# Patient Record
Sex: Female | Born: 1965 | Race: Black or African American | Hispanic: No | Marital: Married | Smoking: Former smoker
Health system: Southern US, Community
[De-identification: ages and names within clinical notes are randomized; demographics above are authoritative.]

## PROBLEM LIST (undated history)

## (undated) DIAGNOSIS — I10 Essential (primary) hypertension: Secondary | ICD-10-CM

## (undated) DIAGNOSIS — M549 Dorsalgia, unspecified: Secondary | ICD-10-CM

## (undated) DIAGNOSIS — J45909 Unspecified asthma, uncomplicated: Secondary | ICD-10-CM

## (undated) DIAGNOSIS — K219 Gastro-esophageal reflux disease without esophagitis: Secondary | ICD-10-CM

## (undated) DIAGNOSIS — D649 Anemia, unspecified: Secondary | ICD-10-CM

## (undated) HISTORY — PX: ESOPHAGOGASTRODUODENOSCOPY: SHX1529

## (undated) HISTORY — PX: LAPAROSCOPIC GASTRIC BANDING: SHX1100

## (undated) HISTORY — PX: COLONOSCOPY: SHX5424

## (undated) HISTORY — PX: CHOLECYSTECTOMY: SHX55

## (undated) HISTORY — PX: BACK SURGERY: SHX140

## (undated) HISTORY — PX: GASTRIC BYPASS: SHX52

---

## 2010-03-22 HISTORY — PX: REDUCTION MAMMAPLASTY: SUR839

## 2015-12-25 ENCOUNTER — Encounter: Payer: Self-pay | Admitting: *Deleted

## 2015-12-25 ENCOUNTER — Ambulatory Visit
Admission: EM | Admit: 2015-12-25 | Discharge: 2015-12-25 | Disposition: A | Discharge status: Home / Self Care | Payer: Federal, State, Local not specified - PPO | Attending: Family Medicine | Admitting: Family Medicine

## 2015-12-25 DIAGNOSIS — M62830 Muscle spasm of back: Secondary | ICD-10-CM | POA: Diagnosis not present

## 2015-12-25 DIAGNOSIS — M5440 Lumbago with sciatica, unspecified side: Secondary | ICD-10-CM | POA: Diagnosis not present

## 2015-12-25 HISTORY — DX: Anemia, unspecified: D64.9

## 2015-12-25 HISTORY — DX: Dorsalgia, unspecified: M54.9

## 2015-12-25 MED ORDER — KETOROLAC TROMETHAMINE 60 MG/2ML IM SOLN
60.0000 mg | Freq: Once | INTRAMUSCULAR | Status: AC
  Administered 2015-12-25: 60 mg via INTRAMUSCULAR
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
## 2015-12-27 ENCOUNTER — Telehealth: Payer: Self-pay | Admitting: Emergency Medicine

## 2015-12-27 NOTE — Telephone Encounter (Signed)
Tried calling patient- no answer.  Left message for patient to call us back if her pain and symptoms are not improving.

## 2016-07-16 ENCOUNTER — Ambulatory Visit
Admission: EM | Admit: 2016-07-16 | Discharge: 2016-07-16 | Disposition: A | Discharge status: Home / Self Care | Payer: Federal, State, Local not specified - PPO | Attending: Family Medicine | Admitting: Family Medicine

## 2016-07-16 ENCOUNTER — Encounter: Payer: Self-pay | Admitting: Emergency Medicine

## 2016-07-16 DIAGNOSIS — J029 Acute pharyngitis, unspecified: Secondary | ICD-10-CM | POA: Diagnosis not present

## 2016-07-16 DIAGNOSIS — R05 Cough: Secondary | ICD-10-CM | POA: Diagnosis not present

## 2016-07-16 LAB — RAPID STREP SCREEN (MED CTR MEBANE ONLY): STREPTOCOCCUS, GROUP A SCREEN (DIRECT): NEGATIVE

## 2016-07-16 MED ORDER — FLUTICASONE PROPIONATE 50 MCG/ACT NA SUSP: 2.0000 | Freq: Every day | NASAL | 0 refills | Status: DC

## 2016-07-16 NOTE — ED Triage Notes (Signed)
Patient c/o sore throat for the past few days.  Patient denies fevers.

## 2016-07-16 NOTE — ED Provider Notes (Signed)
CSN: [id number]     Arrival date & time 07/16/16  1641 History   None    Chief Complaint  Patient presents with  . Sore Throat   (Consider location/radiation/quality/duration/timing/severity/associated sxs/prior Treatment) HPI  [name] who presents with a sore throat that she's had for the past few days. States it is hurting constantly notches with swallowing. She has a mild cough that is not bothersome. She's had no fever or chills.        Past Medical History:  Diagnosis Date  . Anemia   . Back pain    Past Surgical History:  Procedure Laterality Date  . BACK SURGERY     History reviewed. No pertinent family history. Social History  Substance Use Topics  . Smoking status: Former Games developer  . Smokeless tobacco: Never Used  . Alcohol use No   OB History    No data available     Review of Systems  Constitutional: Negative for activity change, chills, fatigue and fever.  HENT: Positive for sore throat.   Respiratory: Positive for cough.   All other systems reviewed and are negative.   Allergies  Gabapentin; Morphine and related; Nsaids; and Topamax [topiramate]  Home Medications   Prior to Admission medications   Medication Sig Start Date End Date Taking? Authorizing Provider  fluticasone (FLONASE) 50 MCG/ACT nasal spray Place 2 sprays into both nostrils daily. 07/16/16   Lutricia Feil, PA-C   Meds Ordered and Administered this Visit  Medications - No data to display  BP 129/84 (BP Location: Left Arm)   Pulse 77   Temp 98.9 F (37.2 C) (Oral)   Resp 16   Ht  (1.676 m)   Wt 195 lb (88.5 kg)   SpO2 99%   BMI 31.47 kg/m  No data found.   Physical Exam  Constitutional: She is oriented to person, place, and time. She appears well-developed and well-nourished. No distress.  HENT:  Head: Normocephalic and atraumatic.  Right Ear: External ear normal.  Left Ear: External ear normal.  Nose: Nose normal.  Mouth/Throat: Oropharynx is  clear and moist. No oropharyngeal exudate.  Eyes: Pupils are equal, round, and reactive to light. Right eye exhibits no discharge. Left eye exhibits no discharge.  Neck: Normal range of motion.  Pulmonary/Chest: Effort normal and breath sounds normal. No respiratory distress. She has no wheezes. She has no rales.  Musculoskeletal: Normal range of motion.  Lymphadenopathy:    She has no cervical adenopathy.  Neurological: She is alert and oriented to person, place, and time.  Skin: Skin is warm and dry. She is not diaphoretic.  Psychiatric: She has a normal mood and affect. Her behavior is normal. Thought content normal.  Nursing note and vitals reviewed.   Urgent Care Course     Procedures (including critical care time)  Labs Review Labs Reviewed  RAPID STREP SCREEN (NOT AT [facility])  CULTURE, GROUP A STREP [facility])    Imaging Review No results found.   Visual Acuity Review  Right Eye Distance:   Left Eye Distance:   Bilateral Distance:    Right Eye Near:   Left Eye Near:    Bilateral Near:         MDM   1. Viral pharyngitis    Discharge Medication List as of 07/16/2016  5:15 PM    START taking these medications   Details  fluticasone (FLONASE) 50 MCG/ACT nasal spray Place 2 sprays into both nostrils daily., Starting Fri  07/16/2016, Normal      Plan: 1. Test/x-ray results and diagnosis reviewed with patient 2. rx as per orders; risks, benefits, potential side effects reviewed with patient 3. Recommend supportive treatment with Rest and fluids. Tylenol for aches and pains ; she is allergic to NSAIDs. Cultures/ sensitivities of the pharynx will be available in 48 hours. I told her that this is likely a viral illness does not require antibiotics at this time. However if the cultures are positive. She will be treated appropriately. I recommended that she consider use of Flonase and Claritin 4. F/u prn if symptoms worsen or don't improve     Lutricia Feil,  PA-C 07/16/16 1723

## 2016-07-18 ENCOUNTER — Telehealth: Payer: Self-pay

## 2016-07-18 LAB — CULTURE, GROUP A STREP (THRC)

## 2016-07-18 MED ORDER — AMOXICILLIN 500 MG PO CAPS: 500.0000 mg | ORAL_CAPSULE | Freq: Two times a day (BID) | ORAL | 0 refills | Status: DC

## 2016-10-12 ENCOUNTER — Telehealth: Payer: Self-pay

## 2016-10-12 NOTE — Telephone Encounter (Signed)
Per Raelyn Moraendra D, patient is switching to workers comp and will be calling to give me the information. However, we cannot accept a referral based on patient phone call. We must get referral from the adjuster with the approval reference number authorizing the visit.

## 2017-06-01 ENCOUNTER — Other Ambulatory Visit: Payer: Self-pay

## 2017-06-01 ENCOUNTER — Ambulatory Visit
Admission: EM | Admit: 2017-06-01 | Discharge: 2017-06-01 | Disposition: A | Discharge status: Home / Self Care | Payer: Federal, State, Local not specified - PPO | Attending: Family Medicine | Admitting: Family Medicine

## 2017-06-01 ENCOUNTER — Encounter: Payer: Self-pay | Admitting: Emergency Medicine

## 2017-06-01 ENCOUNTER — Ambulatory Visit (INDEPENDENT_AMBULATORY_CARE_PROVIDER_SITE_OTHER): Payer: Federal, State, Local not specified - PPO

## 2017-06-01 DIAGNOSIS — M542 Cervicalgia: Secondary | ICD-10-CM | POA: Diagnosis not present

## 2017-06-01 DIAGNOSIS — S161XXA Strain of muscle, fascia and tendon at neck level, initial encounter: Secondary | ICD-10-CM
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
## 2017-06-01 MED ORDER — CYCLOBENZAPRINE HCL 10 MG PO TABS: 10.0000 mg | ORAL_TABLET | Freq: Every day | ORAL | 0 refills | Status: DC

## 2017-06-01 MED ORDER — HYDROCODONE-ACETAMINOPHEN 5-325 MG PO TABS: ORAL_TABLET | ORAL | 0 refills | Status: DC
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
## 2017-09-12 ENCOUNTER — Emergency Department: Payer: Federal, State, Local not specified - PPO

## 2017-09-12 ENCOUNTER — Encounter: Payer: Self-pay | Admitting: Emergency Medicine

## 2017-09-12 ENCOUNTER — Other Ambulatory Visit: Payer: Self-pay

## 2017-09-12 ENCOUNTER — Emergency Department
Admission: EM | Admit: 2017-09-12 | Discharge: 2017-09-12 | Disposition: A | Discharge status: Home / Self Care | Payer: Federal, State, Local not specified - PPO | Attending: Student in an Organized Health Care Education/Training Program | Admitting: Student in an Organized Health Care Education/Training Program

## 2017-09-12 DIAGNOSIS — Z79899 Other long term (current) drug therapy: Secondary | ICD-10-CM | POA: Diagnosis not present

## 2017-09-12 DIAGNOSIS — Z87891 Personal history of nicotine dependence: Secondary | ICD-10-CM | POA: Diagnosis not present

## 2017-09-12 DIAGNOSIS — R202 Paresthesia of skin: Secondary | ICD-10-CM | POA: Diagnosis present

## 2017-09-12 DIAGNOSIS — M5412 Radiculopathy, cervical region: Secondary | ICD-10-CM | POA: Diagnosis not present
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
## 2017-09-12 MED ORDER — PREDNISONE 50 MG PO TABS: ORAL_TABLET | ORAL | 0 refills | Status: DC
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
## 2017-09-12 NOTE — ED Triage Notes (Signed)
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  Today arrives with c/o right neck pain and feeling of hand numbness x 3 days.  AAOx3.  Skin warm and dry. NAD  MAE equally and strong.

## 2017-12-02 ENCOUNTER — Ambulatory Visit
Admission: EM | Admit: 2017-12-02 | Discharge: 2017-12-02 | Disposition: A | Discharge status: Home / Self Care | Payer: Federal, State, Local not specified - PPO | Attending: Family Medicine | Admitting: Family Medicine

## 2017-12-02 ENCOUNTER — Encounter: Payer: Self-pay | Admitting: Emergency Medicine

## 2017-12-02 ENCOUNTER — Other Ambulatory Visit: Payer: Self-pay

## 2017-12-02 DIAGNOSIS — Z885 Allergy status to narcotic agent status: Secondary | ICD-10-CM | POA: Insufficient documentation

## 2017-12-02 DIAGNOSIS — Z79899 Other long term (current) drug therapy: Secondary | ICD-10-CM | POA: Diagnosis not present

## 2017-12-02 DIAGNOSIS — R0789 Other chest pain: Secondary | ICD-10-CM | POA: Insufficient documentation

## 2017-12-02 DIAGNOSIS — Z888 Allergy status to other drugs, medicaments and biological substances status: Secondary | ICD-10-CM | POA: Diagnosis not present

## 2017-12-02 DIAGNOSIS — Z87891 Personal history of nicotine dependence: Secondary | ICD-10-CM | POA: Diagnosis not present

## 2017-12-02 DIAGNOSIS — Z886 Allergy status to analgesic agent status: Secondary | ICD-10-CM | POA: Diagnosis not present

## 2017-12-02 MED ORDER — PREDNISONE 50 MG PO TABS: ORAL_TABLET | ORAL | 0 refills | Status: DC

## 2017-12-02 NOTE — ED Triage Notes (Signed)
Patient c/o chest pain that started this morning.  Patient denies SOB.

## 2017-12-02 NOTE — Discharge Instructions (Addendum)
May apply ice to the area 20 minutes every 2 hours 4-5 times daily.  Your pain worsens or if the character changes in any way go to the emergency room tonight.  Otherwise follow-up with your primary care physician next week

## 2017-12-02 NOTE — ED Provider Notes (Signed)
[facility]    CSN: [id number] Arrival date & time: 12/02/17  1739     History   Chief Complaint Chief Complaint  Patient presents with  . Chest Pain    HPI Armilda Vanderlinden is a 52 y.o. female.   HPI  52 year old female that presents with right-sided anterior chest pain started this morning.  She denies any shortness of breath diaphoresis.  She states that the "pain" initially was in her right upper extremity mostly over the volar forearm went up into her chest.  She does not characterize it as pain.  She has a very difficult time describing exactly what she is feeling.  She is essentially a poor historian.  Present time she has no shortness of breath.  She states that the pain is all around the right breast and around her side.  She has a history of recent radiculopathy from the cervical region and she says that this is not very similar.  Has no cardiac history.        Past Medical History:  Diagnosis Date  . Anemia   . Back pain     There are no active problems to display for this patient.   Past Surgical History:  Procedure Laterality Date  . BACK SURGERY      OB History   None      Home Medications    Prior to Admission medications   Medication Sig Start Date End Date Taking? Authorizing Provider  calcium citrate-vitamin D (CITRACAL+D) 315-200 MG-UNIT tablet Take by mouth. 07/29/17  Yes [provider]  DULoxetine (CYMBALTA) 60 MG capsule Take by mouth. 06/03/17  Yes [provider]  FeFum-FePo-FA-B Cmp-C-Zn-Mn-Cu (SE-TAN PLUS) 162-115.2-1 MG CAPS Take by mouth. 07/29/17  Yes [provider]  traZODone (DESYREL) 100 MG tablet Take 100 mg by mouth at bedtime.   Yes [provider]  acetaminophen (TYLENOL) 500 MG tablet Take 1,000 mg by mouth every 6 (six) hours as needed.    [provider]  amoxicillin (AMOXIL) 500 MG capsule Take 1 capsule (500 mg total) by mouth 2 (two) times daily. 07/18/16    Lutricia Feil, PA-C  Ascorbic Acid 1000 MG TBCR Take 1 tablet by mouth daily.    [provider]  Calcium 500 MG CHEW Chew 1 tablet by mouth 2 (two) times daily.    [provider]  Cholecalciferol (VITAMIN D) 2000 units CAPS Take 1 capsule by mouth daily.    [provider]  Cyanocobalamin (VITAMIN B-12 IJ) Inject as directed every 30 (thirty) days.    [provider]  cyclobenzaprine (FLEXERIL) 10 MG tablet Take 1 tablet (10 mg total) by mouth at bedtime. 06/01/17   Payton Mccallum, MD  Multiple Vitamin (MULTIVITAMIN) tablet Take 2 tablets by mouth daily.    [provider]  predniSONE (DELTASONE) 50 MG tablet 1 tablet daily for the next 5 days 12/02/17   Lutricia Feil, PA-C    Family History Family History  Problem Relation Age of Onset  . Hypertension Mother   . Diabetes Mother   . Pulmonary Hypertension Mother   . Heart disease Mother   . Renal Disease Mother   . Osteoarthritis Mother   . Hyperlipidemia Mother   . COPD Father   . Multiple sclerosis Father     Social History Social History   Tobacco Use  . Smoking status: Former Smoker    Last attempt to quit: 06/02/2007    Years since quitting: 10.5  .  Smokeless tobacco: Never Used  Substance Use Topics  . Alcohol use: Yes    Comment: socially  . Drug use: No     Allergies   Gabapentin; Morphine and related; Nsaids; and Topamax [topiramate]   Review of Systems Review of Systems  Constitutional: Positive for activity change. Negative for chills, fatigue and fever.  Cardiovascular: Positive for chest pain.  All other systems reviewed and are negative.    Physical Exam Triage Vital Signs ED Triage Vitals  Enc Vitals Group     BP 12/02/17 1750 (!) 128/93     Pulse Rate 12/02/17 1750 67     Resp 12/02/17 1750 16     Temp 12/02/17 1750 98.5 F (36.9 C)     Temp Source 12/02/17 1750 Oral     SpO2 12/02/17 1750 100 %     Weight 12/02/17 1746 190 lb (86.2 kg)       Height 12/02/17 1746 5' 6.5" (1.689 m)     Head Circumference --      Peak Flow --      Pain Score 12/02/17 1746 0     Pain Loc --      Pain Edu? --      Excl. in GC? --    No data found.  Updated Vital Signs BP (!) 128/93 (BP Location: Left Arm)   Pulse 67   Temp 98.5 F (36.9 C) (Oral)   Resp 16   Ht 5' 6.5" (1.689 m)   Wt 190 lb (86.2 kg)   SpO2 100%   BMI 30.21 kg/m   Visual Acuity Right Eye Distance:   Left Eye Distance:   Bilateral Distance:    Right Eye Near:   Left Eye Near:    Bilateral Near:     Physical Exam  Constitutional: She is oriented to person, place, and time. She appears well-developed and well-nourished.  Non-toxic appearance. She does not appear ill. No distress.  HENT:  Head: Normocephalic.  Eyes: Pupils are equal, round, and reactive to light.  Neck: Normal range of motion. Neck supple.  Cardiovascular: Normal rate and regular rhythm. Exam reveals no gallop, no S3, no S4 and no distant heart sounds.  No murmur heard. Nation of the chest shows tenderness over the second third intercostal junction the pain radiates out through the ribs around the inferior portion of the breast and into the lateral right rib cage.  This also is tender towards the thoracic's area.  Patient has full thoracic rotation.  Pain is inconsistent with palpation.  No corresponding pain over the left chest wall.  Pulmonary/Chest: Effort normal and breath sounds normal.  Musculoskeletal: Normal range of motion.  Neurological: She is alert and oriented to person, place, and time.  Skin: Skin is warm and dry.  Psychiatric: She has a normal mood and affect. Her behavior is normal.  Nursing note and vitals reviewed.    UC Treatments / Results  Labs (all labs ordered are listed, but only abnormal results are displayed) Labs Reviewed - No data to display  EKG ED ECG REPORT   Date: 12/02/2017  EKG Time: 7:08 PM  Rate: 63 Rhythm: normal sinus rhythm,   there are no  previous tracings available for comparison Intervals:none  ST&T Change: Nonspecific T wave  Narrative Interpretation: Normal sinus rhythm with nonspecific T wave abnormality no acute changes are seen          Radiology No results found.  Procedures Procedures (including critical care time)  Medications  Ordered in UC Medications - No data to display  Initial Impression / Assessment and Plan / UC Course  I have reviewed the triage vital signs and the nursing notes.  Pertinent labs & imaging results that were available during my care of the patient were reviewed by me and considered in my medical decision making (see chart for details).    Plan: 1. Test/x-ray results and diagnosis reviewed with patient 2. rx as per orders; risks, benefits, potential side effects reviewed with patient 3. Recommend supportive treatment with prednisone for anti-inflammatory effect.  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  Noted that I suspect this is chest wall pain and does not represent any cardiac pain.  However if she had any change in the pain in intensifying or any change in the character she should go immediately to the emergency room.  Otherwise she should follow-up with her primary care next week. 4. F/u prn if symptoms worsen or don't improve  Final Clinical Impressions(s) / UC Diagnoses   Final diagnoses:  Chest wall pain     Discharge Instructions     May apply ice to the area 20 minutes every 2 hours 4-5 times daily.  Your pain worsens or if the character changes in any way go to the emergency room tonight.  Otherwise follow-up with your primary care physician next week    ED Prescriptions    Medication Sig Dispense Auth. Provider   predniSONE (DELTASONE) 50 MG tablet 1 tablet daily for the next 5 days 5 tablet Lutricia Feiloemer, William P, PA-C     Controlled Substance Prescriptions [place] Controlled Substance Registry consulted? Not Applicable   Joesph JulyRoemer, William P,  PA-C 12/02/17 1908

## 2018-11-14 ENCOUNTER — Other Ambulatory Visit: Payer: Self-pay | Admitting: Medical Oncology

## 2018-11-14 DIAGNOSIS — Z1231 Encounter for screening mammogram for malignant neoplasm of breast: Secondary | ICD-10-CM

## 2018-11-30 ENCOUNTER — Ambulatory Visit
Admission: RE | Admit: 2018-11-30 | Discharge: 2018-11-30 | Disposition: A | Discharge status: Home / Self Care | Payer: Federal, State, Local not specified - PPO | Source: Ambulatory Visit | Attending: Medical Oncology | Admitting: Medical Oncology

## 2018-11-30 ENCOUNTER — Other Ambulatory Visit: Payer: Self-pay

## 2018-11-30 ENCOUNTER — Encounter (INDEPENDENT_AMBULATORY_CARE_PROVIDER_SITE_OTHER): Payer: Self-pay

## 2018-11-30 DIAGNOSIS — Z1231 Encounter for screening mammogram for malignant neoplasm of breast: Secondary | ICD-10-CM | POA: Diagnosis not present

## 2018-11-30 IMAGING — MG MM DIGITAL SCREENING BILAT W/ TOMO W/ CAD
8 series · 8 of 24 positions shown · non-contrast
Comparison: Previous exam(s).

CLINICAL DATA: Screening.

EXAM:
DIGITAL SCREENING BILATERAL MAMMOGRAM WITH TOMO AND CAD

[R MLO synth-2D]
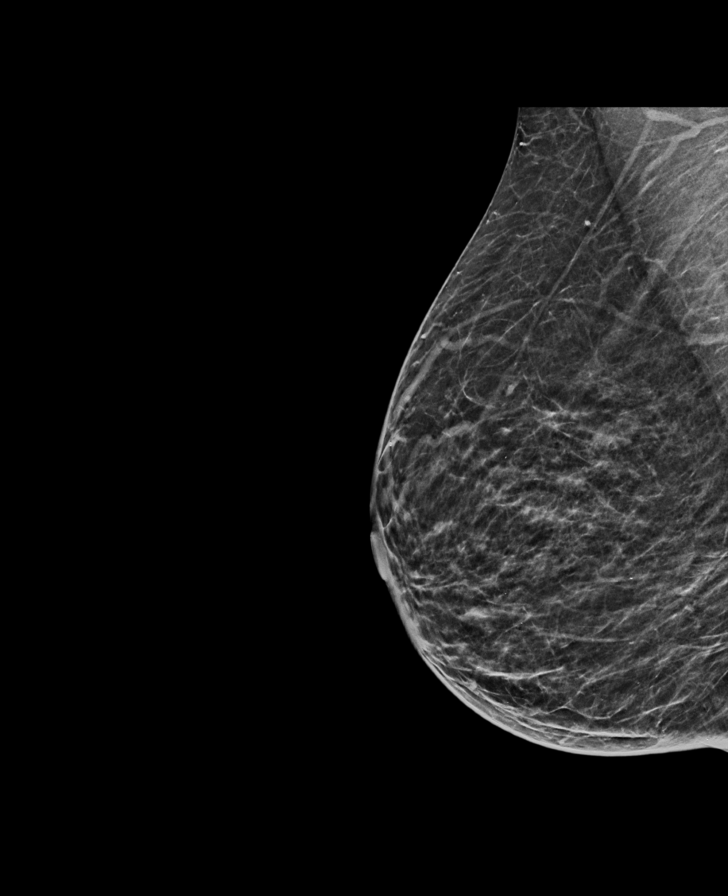

[L MLO synth-2D]
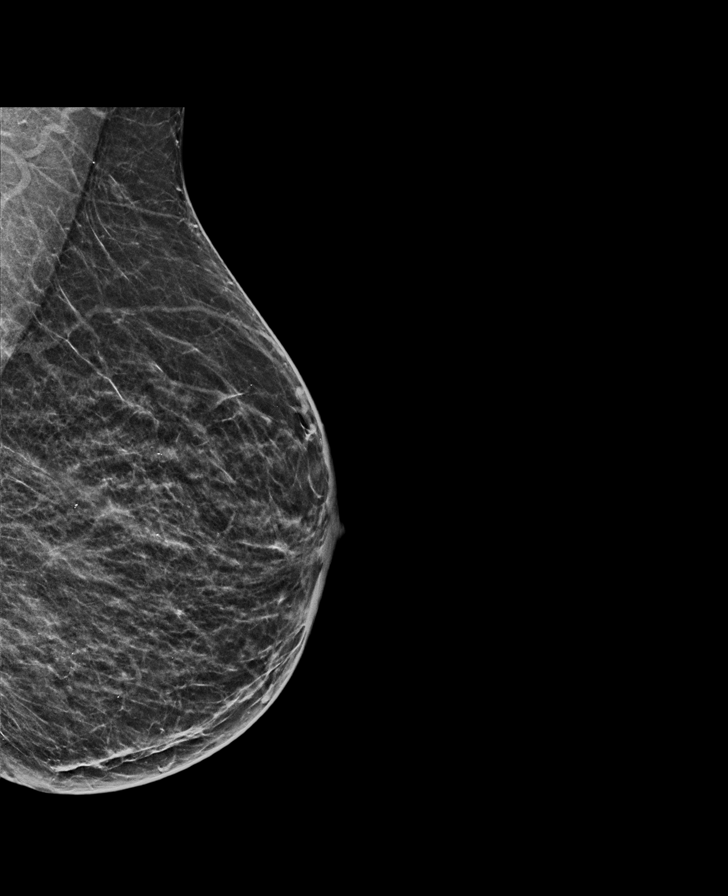

[R CC synth-2D]
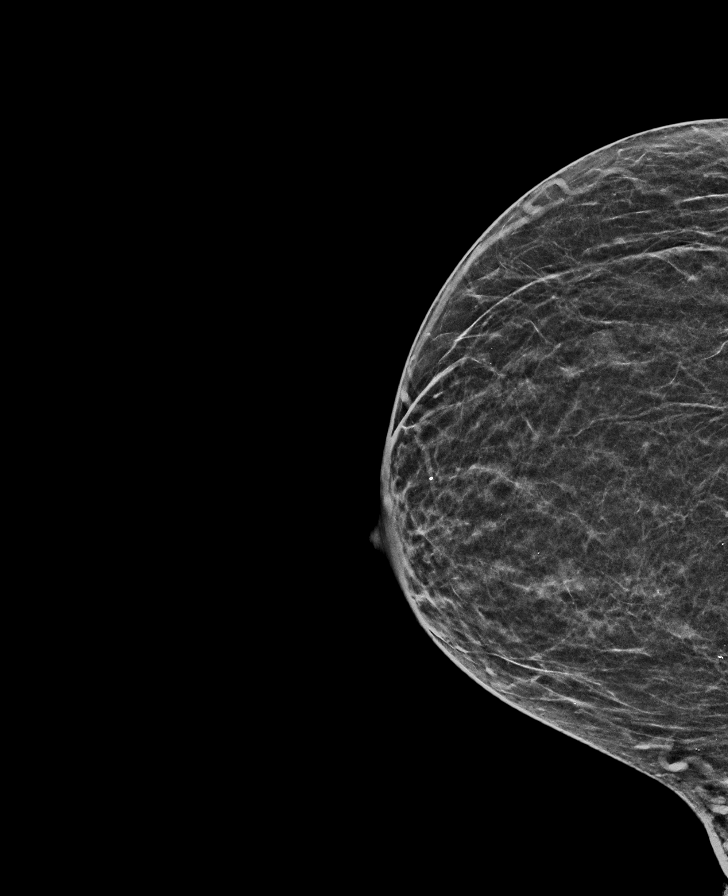

[L CC synth-2D]
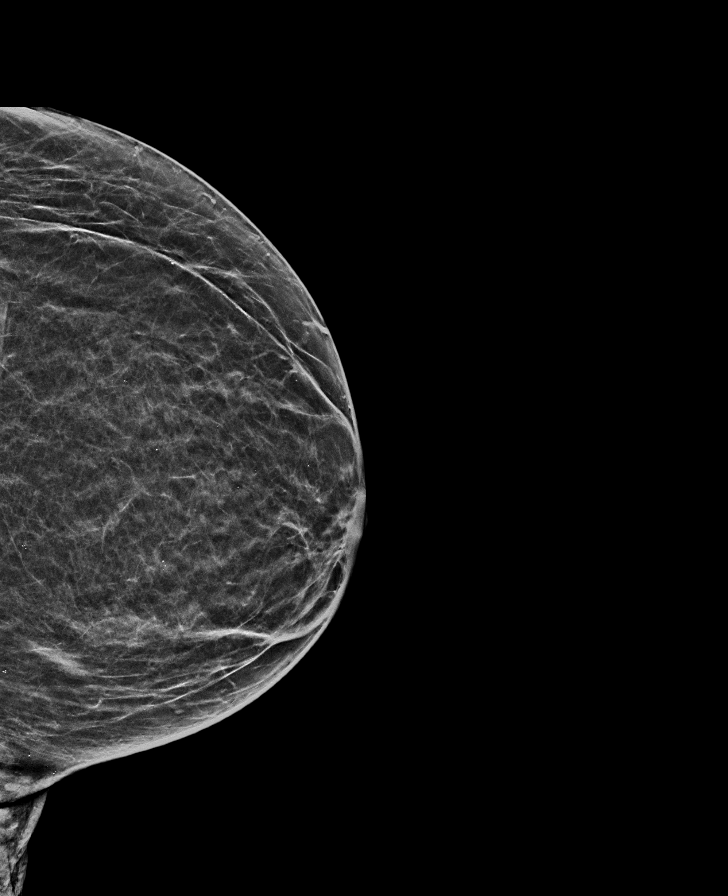

[R CC tomo · tomo slice 23/46.0]
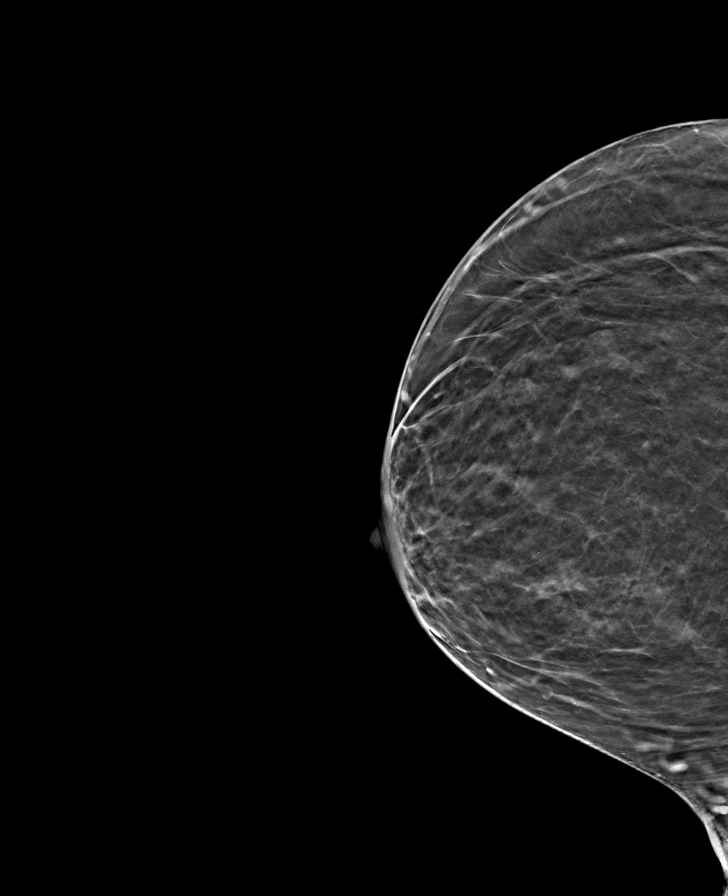

[L MLO tomo · tomo slice 25/50.0]
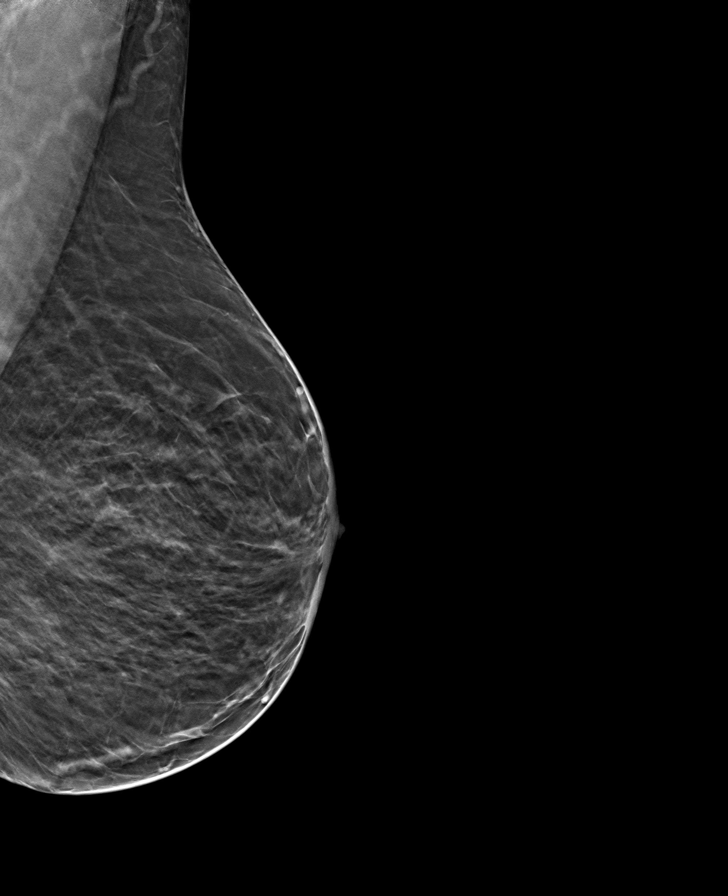

[R MLO tomo · tomo slice 25/49.0]
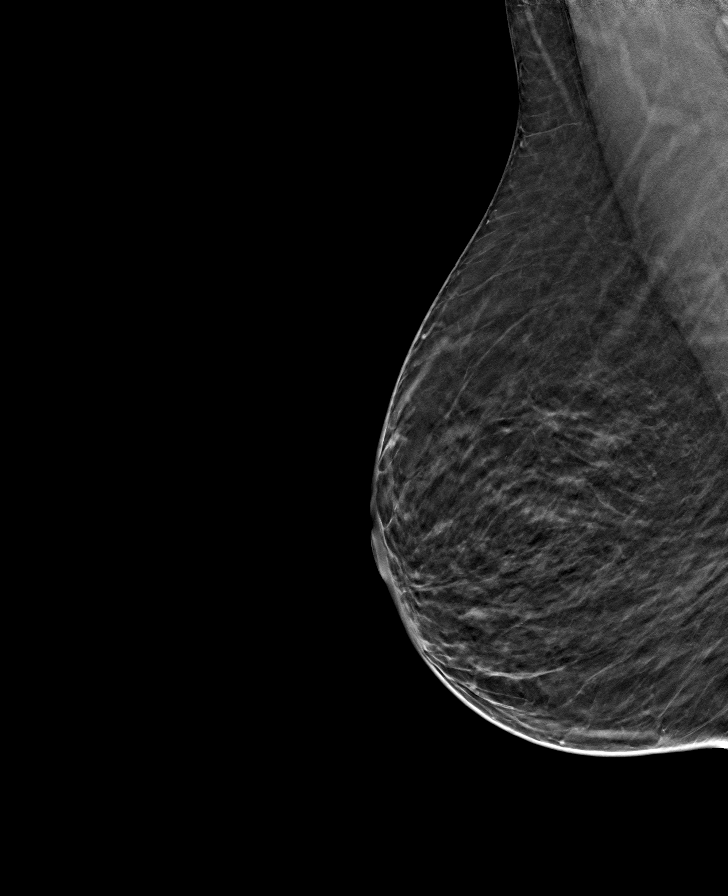

[L CC tomo · tomo slice 25/48.0]
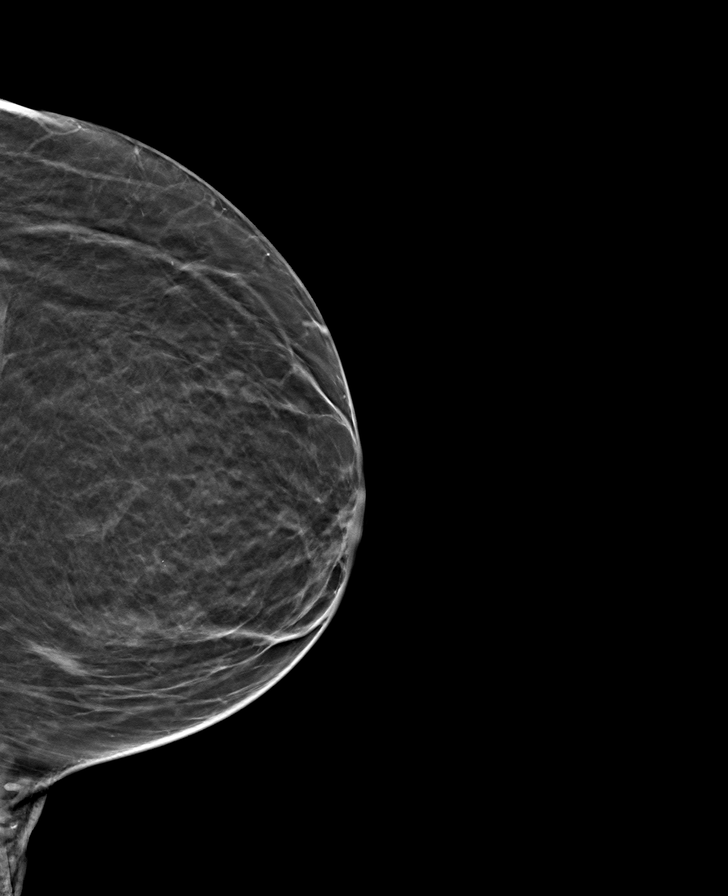

[8 of 24 positions shown; findings below may reference images not displayed]

ACR Breast Density Category b: There are scattered areas of
fibroglandular density.
FINDINGS: There are no findings suspicious for malignancy. Images were
processed with CAD.
IMPRESSION: No mammographic evidence of malignancy. A result letter of this
screening mammogram will be mailed directly to the patient.

RECOMMENDATION:
Screening mammogram in one year. (Code:[TQ])

BI-RADS CATEGORY  1: Negative.

## 2019-10-29 ENCOUNTER — Other Ambulatory Visit: Payer: Self-pay | Admitting: Medical Oncology

## 2019-10-29 DIAGNOSIS — Z1231 Encounter for screening mammogram for malignant neoplasm of breast: Secondary | ICD-10-CM

## 2019-12-03 ENCOUNTER — Other Ambulatory Visit: Payer: Self-pay

## 2019-12-03 ENCOUNTER — Ambulatory Visit
Admission: RE | Admit: 2019-12-03 | Discharge: 2019-12-03 | Disposition: A | Discharge status: Home / Self Care | Payer: Federal, State, Local not specified - PPO | Source: Ambulatory Visit | Attending: Medical Oncology | Admitting: Medical Oncology

## 2019-12-03 DIAGNOSIS — Z1231 Encounter for screening mammogram for malignant neoplasm of breast: Secondary | ICD-10-CM | POA: Insufficient documentation

## 2019-12-03 IMAGING — MG DIGITAL SCREENING BILAT W/ TOMO W/ CAD
6 of 12 series · 6 of 36 positions shown · non-contrast
Comparison: Previous exam(s).

CLINICAL DATA: Screening.

EXAM:
DIGITAL SCREENING BILATERAL MAMMOGRAM WITH TOMO AND CAD

[L MLO synth-2D]
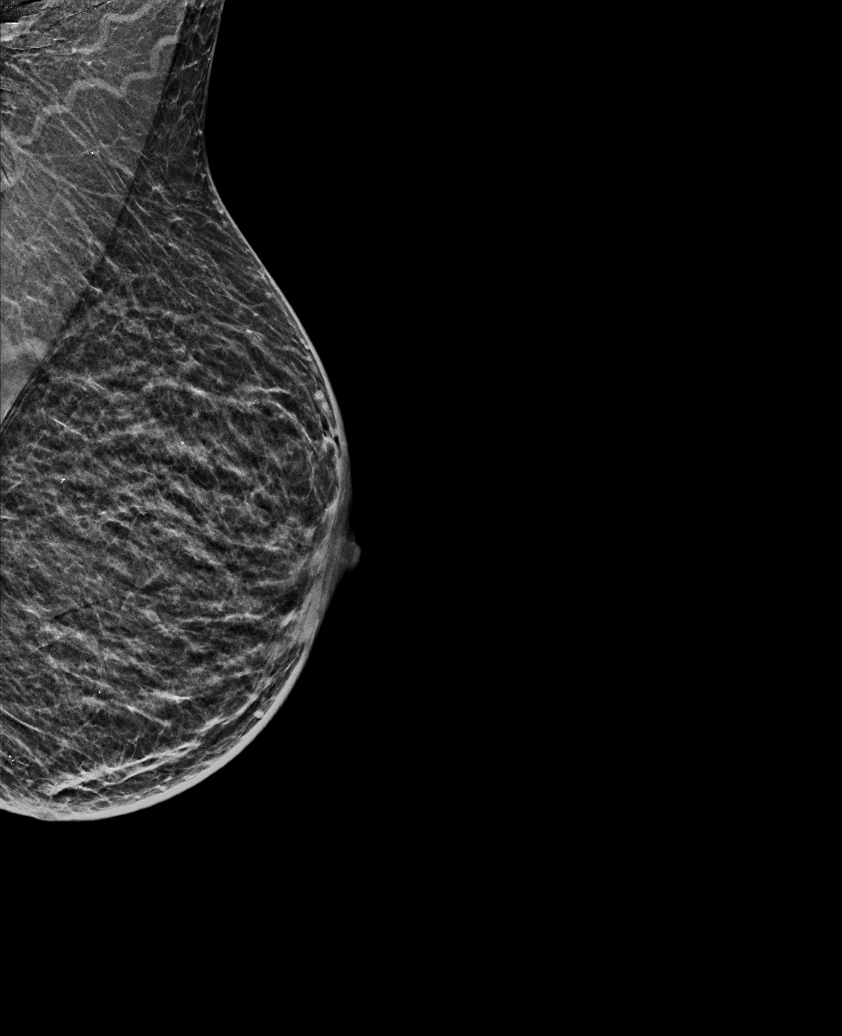

[L CC synth-2D (1 of 2)]
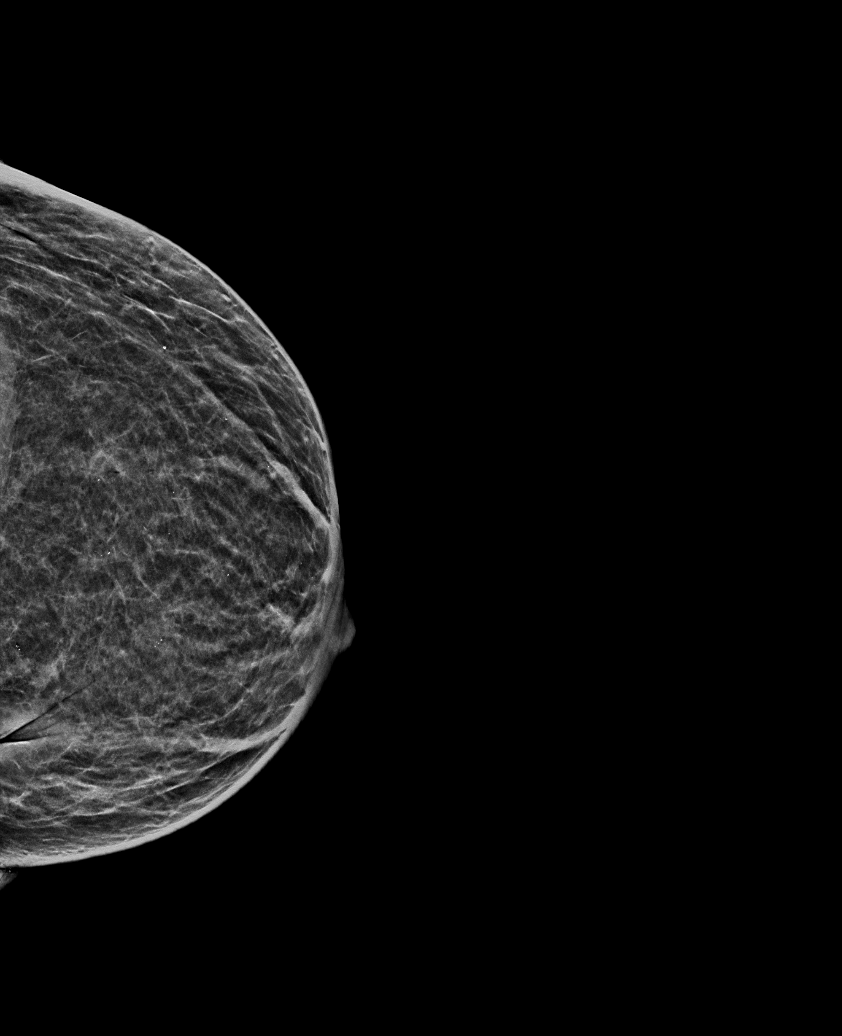

[R MLO synth-2D]
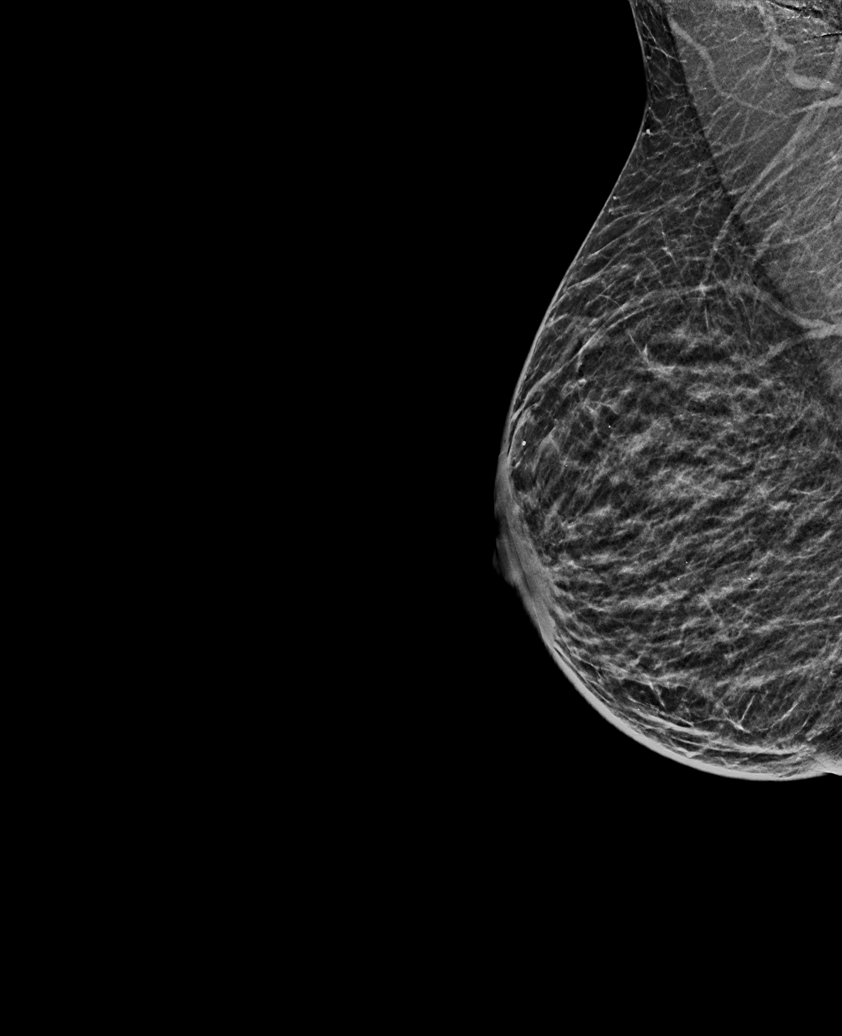

[L CC synth-2D (2 of 2)]
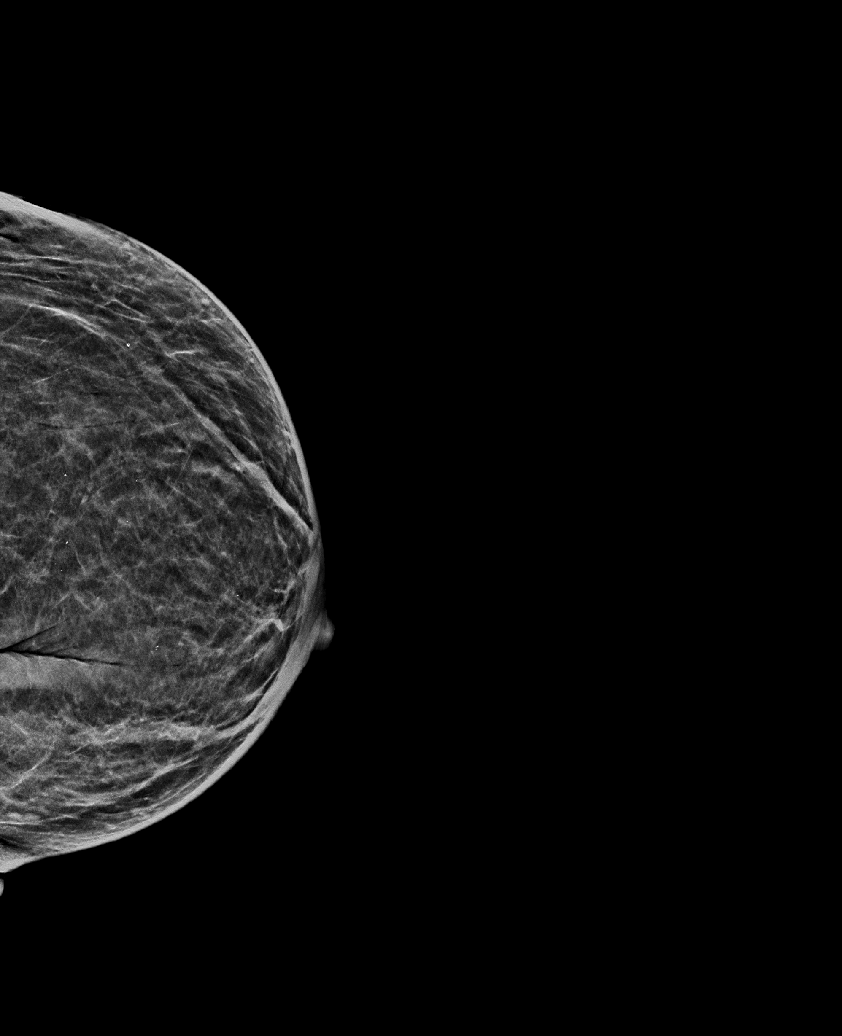

[L XCCM synth-2D]
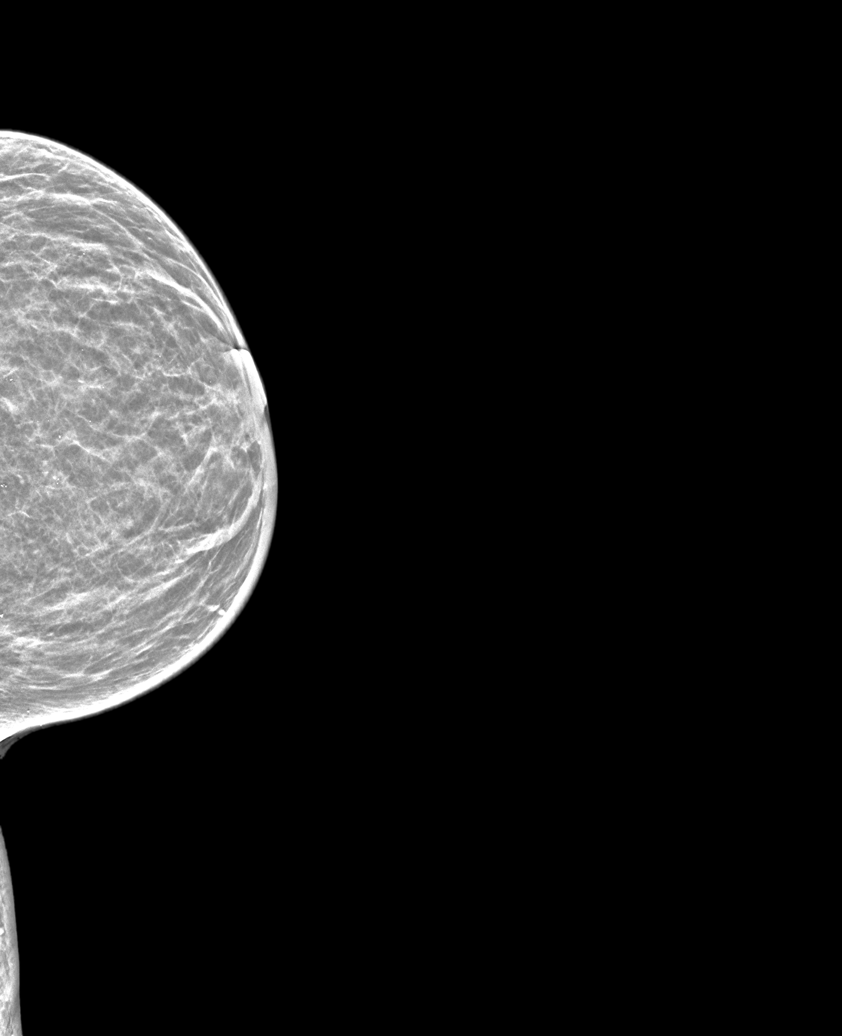

[R CC synth-2D]
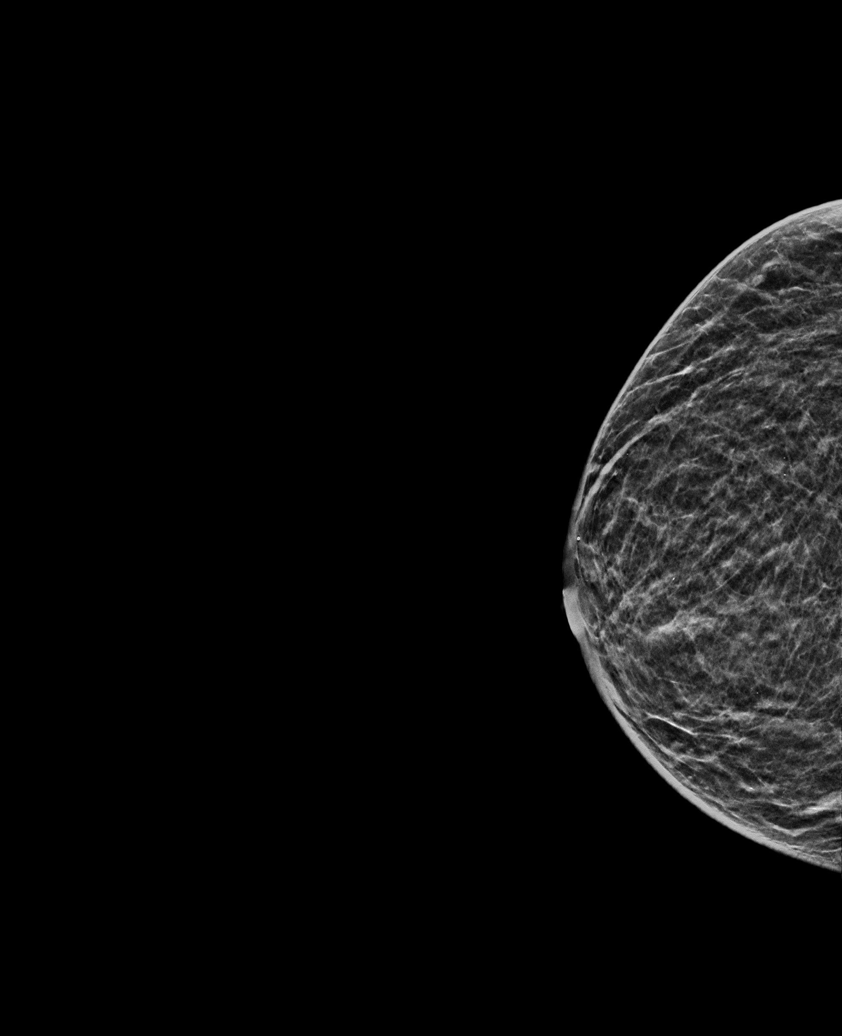

[6 of 36 positions shown; findings below may reference images not displayed]

ACR Breast Density Category b: There are scattered areas of
fibroglandular density.
FINDINGS: There are no findings suspicious for malignancy. Images were
processed with CAD.
IMPRESSION: No mammographic evidence of malignancy. A result letter of this
screening mammogram will be mailed directly to the patient.

RECOMMENDATION:
Screening mammogram in one year. (Code:[TQ])

BI-RADS CATEGORY  1: Negative.

## 2020-02-27 ENCOUNTER — Other Ambulatory Visit: Payer: Self-pay | Admitting: Podiatry

## 2020-03-03 ENCOUNTER — Inpatient Hospital Stay: Admission: RE | Admit: 2020-03-03 | Payer: Federal, State, Local not specified - PPO | Source: Ambulatory Visit

## 2020-03-04 ENCOUNTER — Other Ambulatory Visit: Payer: Self-pay

## 2020-03-04 ENCOUNTER — Encounter
Admission: RE | Admit: 2020-03-04 | Discharge: 2020-03-04 | Disposition: A | Discharge status: Home / Self Care | Payer: Federal, State, Local not specified - PPO | Source: Ambulatory Visit | Attending: Podiatry | Admitting: Podiatry

## 2020-03-04 HISTORY — DX: Gastro-esophageal reflux disease without esophagitis: K21.9

## 2020-03-04 HISTORY — DX: Unspecified asthma, uncomplicated: J45.909

## 2020-03-04 HISTORY — DX: Essential (primary) hypertension: I10

## 2020-03-04 NOTE — Patient Instructions (Signed)
Your procedure is scheduled on: Friday March 07, 2020 Report to the Registration Desk on the 1st floor of the [facility] To find out your arrival time, please call [phone] between 1PM - 3PM on: Thursday March 06, 2020  REMEMBER: Instructions that are not followed completely may result in serious medical risk, up to and including death; or upon the discretion of your surgeon and anesthesiologist your surgery may need to be rescheduled.  Do not eat food after midnight the night before surgery.  No gum chewing, lozengers or hard candies.  You may however, drink CLEAR liquids up to 2 hours before you are scheduled to arrive for your surgery. Do not drink anything within 2 hours of your scheduled arrival time.  Clear liquids include: - water  - apple juice without pulp - black coffee or tea (Do NOT add milk or creamers to the coffee or tea) Do NOT drink anything that is not on this list.  Type 1 and Type 2 diabetics should only drink water.  In addition, your doctor has ordered for you to drink the provided  Ensure Pre-Surgery Clear Carbohydrate Drink  Drinking this carbohydrate drink up to two hours before surgery helps to reduce insulin resistance and improve patient outcomes. Please complete drinking 2 hours prior to scheduled arrival time.  TAKE THESE MEDICATIONS THE MORNING OF SURGERY WITH A SIP OF WATER: NONE  USE FLONASE   One week prior to surgery: Stop Anti-inflammatories (NSAIDS) such as Advil, Aleve, Ibuprofen, Motrin, Naproxen, Naprosyn and Aspirin based products such as Excedrin, Goodys Powder, BC Powder. Stop ANY OVER THE COUNTER supplements until after surgery. (However, you may continue taking Vitamin D, Vitamin B, and multivitamin up until the day before surgery.)  No Alcohol for 24 hours before or after surgery.  No Smoking including e-cigarettes for 24 hours prior to surgery.  No chewable tobacco products for at least 6 hours prior to surgery.  No  nicotine patches on the day of surgery.  Do not use any "recreational" drugs for at least a week prior to your surgery.  Please be advised that the combination of cocaine and anesthesia may have negative outcomes, up to and including death. If you test positive for cocaine, your surgery will be cancelled.  On the morning of surgery brush your teeth with toothpaste and water, you may rinse your mouth with mouthwash if you wish. Do not swallow any toothpaste or mouthwash.  Do not wear jewelry, make-up, hairpins, clips or nail polish.  Do not wear lotions, powders, or perfumes.   Do not shave body from the neck down 48 hours prior to surgery just in case you cut yourself which could leave a site for infection.  Also, freshly shaved skin may become irritated if using the CHG soap.  Contact lenses, hearing aids and dentures may not be worn into surgery.  Do not bring valuables to the hospital. [facility] is not responsible for any missing/lost belongings or valuables.   Use CHG Soap as directed on instruction sheet.  Notify your doctor if there is any change in your medical condition (cold, fever, infection).  Wear comfortable clothing (specific to your surgery type) to the hospital.  Plan for stool softeners for home use; pain medications have a tendency to cause constipation. You can also help prevent constipation by eating foods high in fiber such as fruits and vegetables and drinking plenty of fluids as your diet allows.  After surgery, you can help prevent lung complications by  doing breathing exercises.  Take deep breaths and cough every 1-2 hours. Your doctor may order a device called an Incentive Spirometer to help you take deep breaths. When coughing or sneezing, hold a pillow firmly against your incision with both hands. This is called "splinting." Doing this helps protect your incision. It also decreases belly discomfort.  If you are being discharged the day of surgery, you  will not be allowed to drive home. You will need a responsible adult (18 years or older) to drive you home and stay with you that night.   If you are taking public transportation, you will need to have a responsible adult (18 years or older) with you. Please confirm with your physician that it is acceptable to use public transportation.   Please call the Pre-admissions Testing Dept. at [phone] if you have any questions about these instructions.  Visitation Policy:  Patients undergoing a surgery or procedure may have one family member or support person with them as long as that person is not COVID-19 positive or experiencing its symptoms.  That person may remain in the waiting area during the procedure.  Inpatient Visitation Update:   In an effort to ensure the safety of our team members and our patients, we are implementing a change to our visitation policy:  Effective Monday, Aug. 9, at 7 a.m., inpatients will be allowed one support person.  o The support person may change daily.  o The support person must pass our screening, gel in and out, and wear a mask at all times, including in the patient's room.  o Patients must also wear a mask when staff or their support person are in the room.  o Masking is required regardless of vaccination status.  Systemwide, no visitors 17 or younger.

## 2020-03-05 ENCOUNTER — Other Ambulatory Visit
Admission: RE | Admit: 2020-03-05 | Discharge: 2020-03-05 | Disposition: A | Discharge status: Home / Self Care | Payer: Federal, State, Local not specified - PPO | Source: Ambulatory Visit | Attending: Podiatry | Admitting: Podiatry

## 2020-03-05 DIAGNOSIS — Z9884 Bariatric surgery status: Secondary | ICD-10-CM | POA: Diagnosis not present

## 2020-03-05 DIAGNOSIS — Z01812 Encounter for preprocedural laboratory examination: Secondary | ICD-10-CM | POA: Insufficient documentation

## 2020-03-05 DIAGNOSIS — Z87891 Personal history of nicotine dependence: Secondary | ICD-10-CM | POA: Diagnosis not present

## 2020-03-05 DIAGNOSIS — M2041 Other hammer toe(s) (acquired), right foot: Secondary | ICD-10-CM | POA: Diagnosis not present

## 2020-03-05 DIAGNOSIS — Z20822 Contact with and (suspected) exposure to covid-19: Secondary | ICD-10-CM | POA: Insufficient documentation

## 2020-03-05 DIAGNOSIS — Z7984 Long term (current) use of oral hypoglycemic drugs: Secondary | ICD-10-CM | POA: Diagnosis not present

## 2020-03-05 DIAGNOSIS — Z79899 Other long term (current) drug therapy: Secondary | ICD-10-CM | POA: Diagnosis not present

## 2020-03-05 LAB — SARS CORONAVIRUS 2 (TAT 6-24 HRS): SARS Coronavirus 2: NEGATIVE

## 2020-03-07 ENCOUNTER — Ambulatory Visit: Payer: Federal, State, Local not specified - PPO | Admitting: Certified Registered Nurse Anesthetist

## 2020-03-07 ENCOUNTER — Ambulatory Visit
Admission: RE | Admit: 2020-03-07 | Discharge: 2020-03-07 | Disposition: A | Discharge status: Home / Self Care | Payer: Federal, State, Local not specified - PPO | Attending: Podiatry | Admitting: Podiatry

## 2020-03-07 ENCOUNTER — Encounter
Admission: RE | Disposition: A | Discharge status: Home / Self Care | Payer: Self-pay | Source: Home / Self Care | Attending: Podiatry

## 2020-03-07 ENCOUNTER — Encounter: Payer: Self-pay | Admitting: Podiatry

## 2020-03-07 ENCOUNTER — Other Ambulatory Visit: Payer: Self-pay

## 2020-03-07 DIAGNOSIS — M2041 Other hammer toe(s) (acquired), right foot: Secondary | ICD-10-CM | POA: Diagnosis not present

## 2020-03-07 DIAGNOSIS — Z87891 Personal history of nicotine dependence: Secondary | ICD-10-CM | POA: Insufficient documentation

## 2020-03-07 DIAGNOSIS — Z9884 Bariatric surgery status: Secondary | ICD-10-CM | POA: Insufficient documentation

## 2020-03-07 DIAGNOSIS — Z7984 Long term (current) use of oral hypoglycemic drugs: Secondary | ICD-10-CM | POA: Insufficient documentation

## 2020-03-07 DIAGNOSIS — Z79899 Other long term (current) drug therapy: Secondary | ICD-10-CM | POA: Insufficient documentation

## 2020-03-07 DIAGNOSIS — Z20822 Contact with and (suspected) exposure to covid-19: Secondary | ICD-10-CM | POA: Insufficient documentation

## 2020-03-07 HISTORY — PX: HAMMER TOE SURGERY: SHX385

## 2020-03-07 SURGERY — CORRECTION, HAMMER TOE
Anesthesia: General | Site: Toe | Laterality: Right

## 2020-03-07 MED ORDER — POVIDONE-IODINE 10 % EX SWAB: 2.0000 "application " | Freq: Once | CUTANEOUS | Status: DC

## 2020-03-07 MED ORDER — CHLORHEXIDINE GLUCONATE 0.12 % MT SOLN
OROMUCOSAL | Status: AC
  Administered 2020-03-07: 11:00:00 15 mL via OROMUCOSAL
  Filled 2020-03-07: qty 15

## 2020-03-07 MED ORDER — ORAL CARE MOUTH RINSE: 15.0000 mL | Freq: Once | OROMUCOSAL | Status: AC

## 2020-03-07 MED ORDER — LACTATED RINGERS IV SOLN: INTRAVENOUS | Status: DC

## 2020-03-07 MED ORDER — LIDOCAINE HCL (PF) 2 % IJ SOLN
INTRAMUSCULAR | Status: AC
  Filled 2020-03-07: qty 5

## 2020-03-07 MED ORDER — LIDOCAINE HCL (CARDIAC) PF 100 MG/5ML IV SOSY
PREFILLED_SYRINGE | INTRAVENOUS | Status: DC | PRN
  Administered 2020-03-07: 100 mg via INTRAVENOUS

## 2020-03-07 MED ORDER — FENTANYL CITRATE (PF) 100 MCG/2ML IJ SOLN
INTRAMUSCULAR | Status: AC
  Filled 2020-03-07: qty 2

## 2020-03-07 MED ORDER — ONDANSETRON HCL 4 MG/2ML IJ SOLN: 4.0000 mg | Freq: Once | INTRAMUSCULAR | Status: DC | PRN

## 2020-03-07 MED ORDER — PROPOFOL 10 MG/ML IV BOLUS
INTRAVENOUS | Status: AC
  Filled 2020-03-07: qty 20

## 2020-03-07 MED ORDER — FAMOTIDINE 20 MG PO TABS: 20.0000 mg | ORAL_TABLET | Freq: Once | ORAL | Status: AC

## 2020-03-07 MED ORDER — FAMOTIDINE 20 MG PO TABS
ORAL_TABLET | ORAL | Status: AC
  Administered 2020-03-07: 11:00:00 20 mg via ORAL
  Filled 2020-03-07: qty 1

## 2020-03-07 MED ORDER — MIDAZOLAM HCL 2 MG/2ML IJ SOLN
INTRAMUSCULAR | Status: DC | PRN
  Administered 2020-03-07: 2 mg via INTRAVENOUS

## 2020-03-07 MED ORDER — PROPOFOL 10 MG/ML IV BOLUS
INTRAVENOUS | Status: DC | PRN
  Administered 2020-03-07: 130 mg via INTRAVENOUS
  Administered 2020-03-07: 70 mg via INTRAVENOUS

## 2020-03-07 MED ORDER — BUPIVACAINE LIPOSOME 1.3 % IJ SUSP
INTRAMUSCULAR | Status: DC | PRN
  Administered 2020-03-07: 3.5 mL

## 2020-03-07 MED ORDER — FENTANYL CITRATE (PF) 100 MCG/2ML IJ SOLN: 25.0000 ug | INTRAMUSCULAR | Status: DC | PRN

## 2020-03-07 MED ORDER — FENTANYL CITRATE (PF) 100 MCG/2ML IJ SOLN
INTRAMUSCULAR | Status: DC | PRN
  Administered 2020-03-07 (×2): 50 ug via INTRAVENOUS

## 2020-03-07 MED ORDER — ONDANSETRON HCL 4 MG/2ML IJ SOLN
INTRAMUSCULAR | Status: DC | PRN
  Administered 2020-03-07: 4 mg via INTRAVENOUS

## 2020-03-07 MED ORDER — OXYCODONE-ACETAMINOPHEN 5-325 MG PO TABS: 1.0000 | ORAL_TABLET | Freq: Four times a day (QID) | ORAL | 0 refills | Status: AC | PRN

## 2020-03-07 MED ORDER — BUPIVACAINE HCL (PF) 0.25 % IJ SOLN
INTRAMUSCULAR | Status: DC | PRN
  Administered 2020-03-07: 3.5 mL

## 2020-03-07 MED ORDER — CHLORHEXIDINE GLUCONATE 0.12 % MT SOLN: 15.0000 mL | Freq: Once | OROMUCOSAL | Status: AC

## 2020-03-07 MED ORDER — MIDAZOLAM HCL 2 MG/2ML IJ SOLN
INTRAMUSCULAR | Status: AC
  Filled 2020-03-07: qty 2

## 2020-03-07 MED ORDER — CEFAZOLIN SODIUM-DEXTROSE 2-4 GM/100ML-% IV SOLN
2.0000 g | INTRAVENOUS | Status: AC
  Administered 2020-03-07: 12:00:00 2 g via INTRAVENOUS

## 2020-03-07 MED ORDER — DEXAMETHASONE SODIUM PHOSPHATE 10 MG/ML IJ SOLN
INTRAMUSCULAR | Status: DC | PRN
  Administered 2020-03-07: 4 mg via INTRAVENOUS

## 2020-03-07 MED ORDER — DEXAMETHASONE SODIUM PHOSPHATE 10 MG/ML IJ SOLN
INTRAMUSCULAR | Status: AC
  Filled 2020-03-07: qty 1

## 2020-03-07 MED ORDER — ONDANSETRON HCL 4 MG/2ML IJ SOLN
INTRAMUSCULAR | Status: AC
  Filled 2020-03-07: qty 2

## 2020-03-07 SURGICAL SUPPLY — 55 items
APL SKNCLS STERI-STRIP NONHPOA (GAUZE/BANDAGES/DRESSINGS) ×1
BENZOIN TINCTURE PRP APPL 2/3 (GAUZE/BANDAGES/DRESSINGS) ×2 IMPLANT
BLADE MED AGGRESSIVE (BLADE) IMPLANT
BLADE OSC/SAGITTAL MD 5.5X18 (BLADE) IMPLANT
BLADE SURG 15 STRL LF DISP TIS (BLADE) IMPLANT
BLADE SURG 15 STRL SS (BLADE)
BNDG CMPR 75X41 PLY HI ABS (GAUZE/BANDAGES/DRESSINGS) ×1
BNDG CMPR STD VLCR NS LF 5.8X4 (GAUZE/BANDAGES/DRESSINGS) ×1
BNDG COHESIVE 4X5 TAN STRL (GAUZE/BANDAGES/DRESSINGS) ×2 IMPLANT
BNDG ELASTIC 4X5.8 VLCR NS LF (GAUZE/BANDAGES/DRESSINGS) ×2 IMPLANT
BNDG ESMARK 4X12 TAN STRL LF (GAUZE/BANDAGES/DRESSINGS) ×2 IMPLANT
BNDG GAUZE 4.5X4.1 6PLY STRL (MISCELLANEOUS) ×2 IMPLANT
BNDG STRETCH 4X75 STRL LF (GAUZE/BANDAGES/DRESSINGS) ×2 IMPLANT
CANISTER SUCT 1200ML W/VALVE (MISCELLANEOUS) ×2 IMPLANT
COVER LIGHT HANDLE STERIS (MISCELLANEOUS) ×4 IMPLANT
COVER WAND RF STERILE (DRAPES) ×2 IMPLANT
CUFF TOURN SGL QUICK 12 (TOURNIQUET CUFF) IMPLANT
CUFF TOURN SGL QUICK 18X4 (TOURNIQUET CUFF) ×2 IMPLANT
DRAPE FLUOR MINI C-ARM 54X84 (DRAPES) ×2 IMPLANT
DURAPREP 26ML APPLICATOR (WOUND CARE) ×2 IMPLANT
ELECT REM PT RETURN 9FT ADLT (ELECTROSURGICAL) ×2
ELECTRODE REM PT RTRN 9FT ADLT (ELECTROSURGICAL) ×1 IMPLANT
GAUZE SPONGE 4X4 12PLY STRL (GAUZE/BANDAGES/DRESSINGS) ×2 IMPLANT
GAUZE XEROFORM 1X8 LF (GAUZE/BANDAGES/DRESSINGS) ×2 IMPLANT
GLOVE BIO SURGEON STRL SZ7.5 (GLOVE) ×2 IMPLANT
GLOVE INDICATOR 8.0 STRL GRN (GLOVE) ×2 IMPLANT
GOWN STRL REUS W/ TWL XL LVL3 (GOWN DISPOSABLE) ×2 IMPLANT
GOWN STRL REUS W/TWL XL LVL3 (GOWN DISPOSABLE) ×4
K-WIRE DBL END TROCAR 6X.045 (WIRE)
K-WIRE DBL END TROCAR 6X.062 (WIRE)
KIT TURNOVER KIT A (KITS) ×2 IMPLANT
KWIRE DBL END TROCAR 6X.045 (WIRE) IMPLANT
KWIRE DBL END TROCAR 6X.062 (WIRE) IMPLANT
MANIFOLD NEPTUNE II (INSTRUMENTS) ×2 IMPLANT
NEEDLE HYPO 22GX1.5 SAFETY (NEEDLE) ×2 IMPLANT
NS IRRIG 500ML POUR BTL (IV SOLUTION) ×2 IMPLANT
PACK EXTREMITY ARMC (MISCELLANEOUS) ×2 IMPLANT
PIN BALLS 3/8 F/.045 WIRE (MISCELLANEOUS) ×2 IMPLANT
RASP SM TEAR CROSS CUT (RASP) IMPLANT
STOCKINETTE IMPERV 14X48 (MISCELLANEOUS) ×2 IMPLANT
STRAP SAFETY 5IN WIDE (MISCELLANEOUS) ×2 IMPLANT
STRIP CLOSURE SKIN 1/4X4 (GAUZE/BANDAGES/DRESSINGS) ×2 IMPLANT
SUT ETHILON 4-0 (SUTURE)
SUT ETHILON 4-0 FS2 18XMFL BLK (SUTURE)
SUT ETHILON 5-0 FS-2 18 BLK (SUTURE) IMPLANT
SUT MNCRL 5-0+ PC-1 (SUTURE) IMPLANT
SUT MONOCRYL 5-0 (SUTURE)
SUT VIC AB 2-0 SH 27 (SUTURE)
SUT VIC AB 2-0 SH 27XBRD (SUTURE) IMPLANT
SUT VIC AB 3-0 SH 27 (SUTURE)
SUT VIC AB 3-0 SH 27X BRD (SUTURE) IMPLANT
SUT VIC AB 4-0 FS2 27 (SUTURE) ×2 IMPLANT
SUTURE ETHLN 4-0 FS2 18XMF BLK (SUTURE) IMPLANT
SYR 50ML LL SCALE MARK (SYRINGE) ×2 IMPLANT
WIRE Z .045 C-WIRE SPADE TIP (WIRE) ×2 IMPLANT

## 2020-03-07 NOTE — Transfer of Care (Signed)
Immediate Anesthesia Transfer of Care Note  Patient: Emily Pollard  Procedure(s) Performed: HAMMER TOE CORRECTION (Right Toe)  Patient Location: PACU  Anesthesia Type:General  Level of Consciousness: awake and alert   Airway & Oxygen Therapy: Patient Spontanous Breathing and Patient connected to face mask oxygen  Post-op Assessment: Report given to RN and Post -op Vital signs reviewed and stable  Post vital signs: Reviewed and stable  Last Vitals:  Vitals Value Taken Time  BP    Temp    Pulse 63 03/07/20 1239  Resp 10 03/07/20 1239  SpO2 100 % 03/07/20 1239  Vitals shown include unvalidated device data.  Last Pain:  Vitals:   03/07/20 1112  TempSrc: Tympanic  PainSc: 3          Complications: No complications documented.

## 2020-03-07 NOTE — Op Note (Signed)
Operative note   Surgeon:Briyanna Billingham    Assistant:None    Preop diagnosis: Hammertoe right fifth toe    Postop diagnosis: Same    Procedure: Arthroplasty right fifth toe PIPJ    EBL: Minimal    Anesthesia:local and general    Hemostasis: Ankle tourniquet inflated to 200 mmHg for 19 minutes    Specimen: None    Complications: None    Operative indications:Emily Pollard is an 54 y.o. that presents today for surgical intervention.  The risks/benefits/alternatives/complications have been discussed and consent has been given.    Procedure:  Patient was brought into the OR and placed on the operating table in thesupine position. After anesthesia was obtained theright lower extremity was prepped and draped in usual sterile fashion.  Attention was directed to the lateral aspect of the PIPJ where 2 semielliptical incisions were made dorsal distal to proximal lateral.  Full-thickness flap was created down to the level of the long extensor tendon.  Extensor tendon was then released.  The PIPJ was then exposed.  The head of the proximal phalanx was removed from the surgical field in toto.  The wound was flushed with copious amounts of irrigation.  At this time a 0.045 K wire was driven from the base of the middle phalanx to the tip of the toe and retrograded back to the base of the proximal phalanx.  Good alignment and stability was noted.  The toe was placed in a derotated position and suturing was performed to close.  At this time closure was performed with a 4-0 Vicryl in a 4-0 nylon.  Bulky sterile dressing was applied.    Patient tolerated the procedure and anesthesia well.  Was transported from the OR to the PACU with all vital signs stable and vascular status intact. To be discharged per routine protocol.  Will follow up in approximately 1 week in the outpatient clinic.

## 2020-03-07 NOTE — Discharge Instructions (Addendum)
[facility]  POST OPERATIVE INSTRUCTIONS FOR DR. TROXLER, DR. FOWLER, AND DR. [facility] PODIATRY DEPARTMENT   1. Take your medication as prescribed.  Pain medication should be taken only as needed.  2. Keep the dressing clean, dry and intact.  3. Keep your foot elevated above the heart level for the first 48 hours.  4. Walking to the bathroom and brief periods of walking are acceptable, unless we have instructed you to be non-weight bearing.  5. Always wear your post-op shoe when walking.  Always use your crutches if you are to be non-weight bearing.  6. Do not take a shower. Baths are permissible as long as the foot is kept out of the water.   7. Every hour you are awake:  - Bend your knee 15 times. - Flex foot 15 times - Massage calf 15 times  8. Call [facility] ([phone]) if any of the following problems occur: - You develop a temperature or fever. - The bandage becomes saturated with blood. - Medication does not stop your pain. - Injury of the foot occurs. - Any symptoms of infection including redness, odor, or red streaks running from wound.  Bupivacaine Liposomal Suspension for Injection What is this medicine? BUPIVACAINE LIPOSOMAL (bue PIV a kane LIP oh som al) is an anesthetic. It causes loss of feeling in the skin or other tissues. It is used to prevent and to treat pain from some procedures. This medicine may be used for other purposes; ask your health care provider or pharmacist if you have questions. COMMON BRAND NAME(S): EXPAREL What should I tell my health care provider before I take this medicine? They need to know if you have any of these conditions:  G6PD deficiency  heart disease  kidney disease  liver disease  low blood pressure  lung or breathing disease, like asthma  an unusual or allergic reaction to bupivacaine, other medicines, foods, dyes, or preservatives  pregnant or trying  to get pregnant  breast-feeding How should I use this medicine? This medicine is for injection into the affected area. It is given by a health care professional in a hospital or clinic setting. Talk to your pediatrician regarding the use of this medicine in children. Special care may be needed. Overdosage: If you think you have taken too much of this medicine contact a poison control center or emergency room at once. NOTE: This medicine is only for you. Do not share this medicine with others. What if I miss a dose? This does not apply. What may interact with this medicine? This medicine may interact with the following medications:  acetaminophen  certain antibiotics like dapsone, nitrofurantoin, aminosalicylic acid, sulfonamides  certain medicines for seizures like phenobarbital, phenytoin, valproic acid  chloroquine  cyclophosphamide  flutamide  hydroxyurea  ifosfamide  metoclopramide  nitric oxide  nitroglycerin  nitroprusside  nitrous oxide  other local anesthetics like lidocaine, pramoxine, tetracaine  primaquine  quinine  rasburicase  sulfasalazine This list may not describe all possible interactions. Give your health care provider a list of all the medicines, herbs, non-prescription drugs, or dietary supplements you use. Also tell them if you smoke, drink alcohol, or use illegal drugs. Some items may interact with your medicine. What should I watch for while using this medicine? Your condition will be monitored carefully while you are receiving this medicine. Be careful to avoid injury while the area is numb, and you are not aware of pain. What side effects may   I notice from receiving this medicine? Side effects that you should report to your doctor or health care professional as soon as possible:  allergic reactions like skin rash, itching or hives, swelling of the face, lips, or tongue  seizures  signs and symptoms of a dangerous change in heartbeat  or heart rhythm like chest pain; dizziness; fast, irregular heartbeat; palpitations; feeling faint or lightheaded; falls; breathing problems  signs and symptoms of methemoglobinemia such as pale, gray, or blue colored skin; headache; fast heartbeat; shortness of breath; feeling faint or lightheaded, falls; tiredness Side effects that usually do not require medical attention (report to your doctor or health care professional if they continue or are bothersome):  anxious  back pain  changes in taste  changes in vision  constipation  dizziness  fever  nausea, vomiting This list may not describe all possible side effects. Call your doctor for medical advice about side effects. You may report side effects to FDA at 1-800-FDA-1088. Where should I keep my medicine? This drug is given in a hospital or clinic and will not be stored at home. NOTE: This sheet is a summary. It may not cover all possible information. If you have questions about this medicine, talk to your doctor, pharmacist, or health care provider.  2020 Elsevier/Gold Standard (2018-12-19 10:48:23)   AMBULATORY SURGERY  DISCHARGE INSTRUCTIONS   1) The drugs that you were given will stay in your system until tomorrow so for the next 24 hours you should not:  A) Drive an automobile B) Make any legal decisions C) Drink any alcoholic beverage   2) You may resume regular meals tomorrow.  Today it is better to start with liquids and gradually work up to solid foods.  You may eat anything you prefer, but it is better to start with liquids, then soup and crackers, and gradually work up to solid foods.   3) Please notify your doctor immediately if you have any unusual bleeding, trouble breathing, redness and pain at the surgery site, drainage, fever, or pain not relieved by medication.    4) Additional Instructions:        Please contact your physician with any problems or Same Day Surgery at [phone], Monday  through Friday 6 am to 4 pm, or [place] [facility] [place] Main number at [phone]. 

## 2020-03-07 NOTE — Anesthesia Procedure Notes (Signed)
Procedure Name: LMA Insertion Date/Time: 03/07/2020 11:52 AM Performed by: Milagros Reap, CRNA Pre-anesthesia Checklist: Patient identified, Emergency Drugs available, Suction available, Patient being monitored and Timeout performed Patient Re-evaluated:Patient Re-evaluated prior to induction Oxygen Delivery Method: Circle system utilized Preoxygenation: Pre-oxygenation with 100% oxygen Induction Type: IV induction Ventilation: Mask ventilation without difficulty LMA: LMA inserted LMA Size: 3.0 ETT to lip (cm): soft guaze to protect left upper loose tooth. Tube secured with: Tape

## 2020-03-07 NOTE — Anesthesia Preprocedure Evaluation (Signed)
Anesthesia Evaluation  Patient identified by MRN, date of birth, ID band Patient awake    Reviewed: Allergy & Precautions, NPO status , Patient's Chart, lab work & pertinent test results  Airway Mallampati: II  TM Distance: >3 FB     Dental  (+) Teeth Intact   Pulmonary asthma , former smoker,    Pulmonary exam normal        Cardiovascular hypertension, Normal cardiovascular exam     Neuro/Psych negative neurological ROS  negative psych ROS   GI/Hepatic Neg liver ROS, GERD  ,  Endo/Other  negative endocrine ROS  Renal/GU negative Renal ROS  negative genitourinary   Musculoskeletal negative musculoskeletal ROS (+)   Abdominal Normal abdominal exam  (+)   Peds negative pediatric ROS (+)  Hematology  (+) anemia ,   Anesthesia Other Findings   Reproductive/Obstetrics                             Anesthesia Physical Anesthesia Plan  ASA: II  Anesthesia Plan: General   Post-op Pain Management:    Induction: Intravenous  PONV Risk Score and Plan:   Airway Management Planned: LMA  Additional Equipment:   Intra-op Plan:   Post-operative Plan: Extubation in OR  Informed Consent: I have reviewed the patients History and Physical, chart, labs and discussed the procedure including the risks, benefits and alternatives for the proposed anesthesia with the patient or authorized representative who has indicated his/her understanding and acceptance.     Dental advisory given  Plan Discussed with: CRNA and Surgeon  Anesthesia Plan Comments:         Anesthesia Quick Evaluation

## 2020-03-07 NOTE — H&P (Signed)
HISTORY AND PHYSICAL INTERVAL NOTE:  03/07/2020  11:40 AM  Emily Pollard  has presented today for surgery, with the diagnosis of M20.41 HAMMERTOE RIGHT FOOT.  The various methods of treatment have been discussed with the patient.  No guarantees were given.  After consideration of risks, benefits and other options for treatment, the patient has consented to surgery.  I have reviewed the patients' chart and labs.     A history and physical examination was performed in my office.  The patient was reexamined.  There have been no changes to this history and physical examination.  Emily Pollard A

## 2020-03-10 ENCOUNTER — Encounter: Payer: Self-pay | Admitting: Podiatry

## 2020-03-10 NOTE — Anesthesia Postprocedure Evaluation (Signed)
Anesthesia Post Note  Patient: Emily Pollard  Procedure(s) Performed: HAMMER TOE CORRECTION (Right Toe)  Patient location during evaluation: PACU Anesthesia Type: General Level of consciousness: awake and alert and oriented Pain management: pain level controlled Vital Signs Assessment: post-procedure vital signs reviewed and stable Respiratory status: spontaneous breathing Cardiovascular status: blood pressure returned to baseline Anesthetic complications: no   No complications documented.   Last Vitals:  Vitals:   03/07/20 1403 03/07/20 1423  BP: 127/84 111/67  Pulse: 62 74  Resp: 16 18  Temp: 36.8 C   SpO2: 100% 98%    Last Pain:  Vitals:   03/10/20 0946  TempSrc:   PainSc: 4                  Anush Wiedeman

## 2020-11-17 ENCOUNTER — Other Ambulatory Visit: Payer: Self-pay | Admitting: Family Medicine

## 2020-11-17 DIAGNOSIS — Z1231 Encounter for screening mammogram for malignant neoplasm of breast: Secondary | ICD-10-CM

## 2020-12-04 ENCOUNTER — Other Ambulatory Visit: Payer: Self-pay

## 2020-12-04 ENCOUNTER — Ambulatory Visit
Admission: RE | Admit: 2020-12-04 | Discharge: 2020-12-04 | Disposition: A | Discharge status: Home / Self Care | Payer: Federal, State, Local not specified - PPO | Source: Ambulatory Visit | Attending: Family Medicine | Admitting: Family Medicine

## 2020-12-04 DIAGNOSIS — Z1231 Encounter for screening mammogram for malignant neoplasm of breast: Secondary | ICD-10-CM | POA: Diagnosis present

## 2020-12-11 ENCOUNTER — Other Ambulatory Visit: Payer: Self-pay | Admitting: Family Medicine

## 2020-12-11 DIAGNOSIS — R928 Other abnormal and inconclusive findings on diagnostic imaging of breast: Secondary | ICD-10-CM

## 2020-12-11 DIAGNOSIS — N6310 Unspecified lump in the right breast, unspecified quadrant: Secondary | ICD-10-CM

## 2020-12-15 ENCOUNTER — Ambulatory Visit
Admission: RE | Admit: 2020-12-15 | Discharge: 2020-12-15 | Disposition: A | Discharge status: Home / Self Care | Payer: Federal, State, Local not specified - PPO | Source: Ambulatory Visit | Attending: Family Medicine | Admitting: Family Medicine

## 2020-12-15 ENCOUNTER — Other Ambulatory Visit: Payer: Self-pay

## 2020-12-15 DIAGNOSIS — R928 Other abnormal and inconclusive findings on diagnostic imaging of breast: Secondary | ICD-10-CM | POA: Diagnosis present

## 2020-12-15 DIAGNOSIS — N6310 Unspecified lump in the right breast, unspecified quadrant: Secondary | ICD-10-CM | POA: Diagnosis present

## 2020-12-15 IMAGING — US US BREAST*R* LIMITED INC AXILLA
1 series · 7 of 7 positions shown · non-contrast
Comparison: Previous exam(s).

CLINICAL DATA: Right breast asymmetry seen on most recent screening
mammography.

The patient reports 100 pounds weight.
EXAM:
DIGITAL DIAGNOSTIC UNILATERAL RIGHT MAMMOGRAM WITH TOMOSYNTHESIS AND
CAD; ULTRASOUND RIGHT BREAST LIMITED
TECHNIQUE: Right digital diagnostic mammography and breast tomosynthesis was
performed. The images were evaluated with computer-aided detection.;
Targeted ultrasound examination of the right breast was performed

[Series 1: us breast*right* limited inc axilla · 0.03mm/px · 7 of 7 slices shown]
[im 1/7]
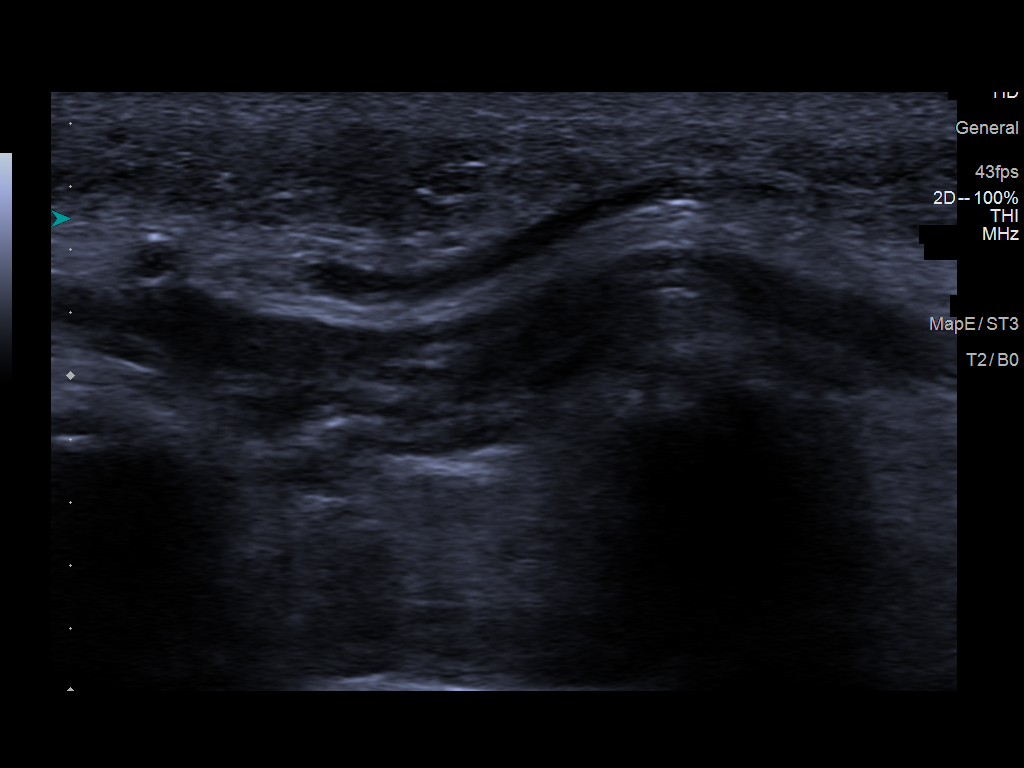
[im 2/7]
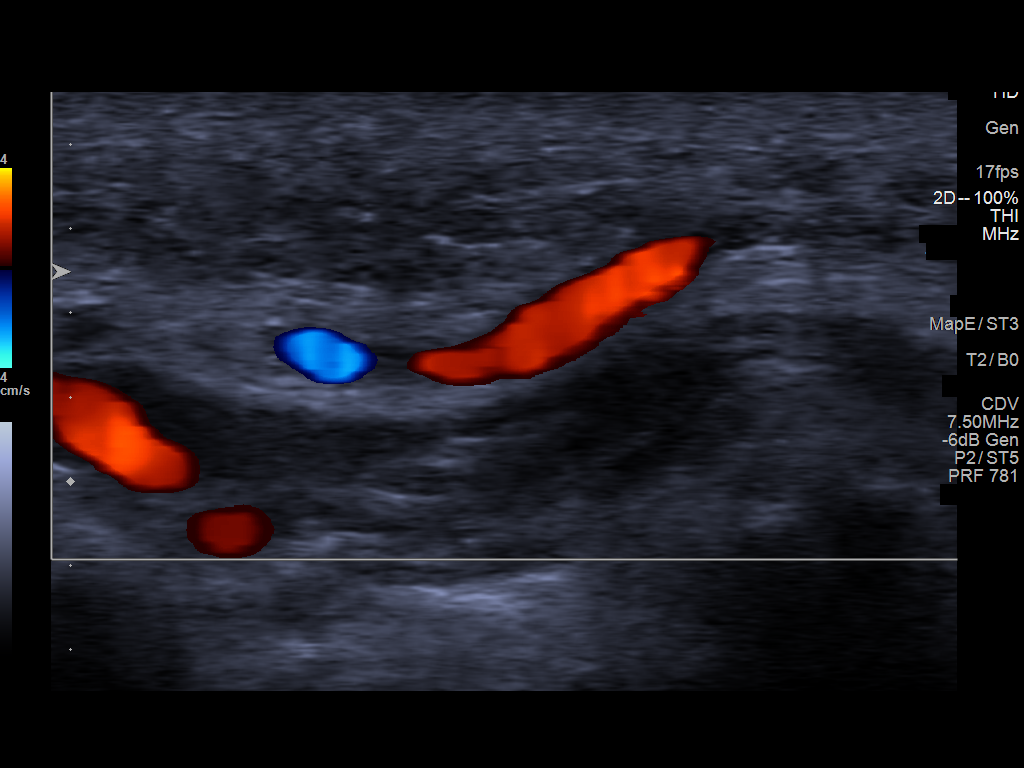
[im 3/7]
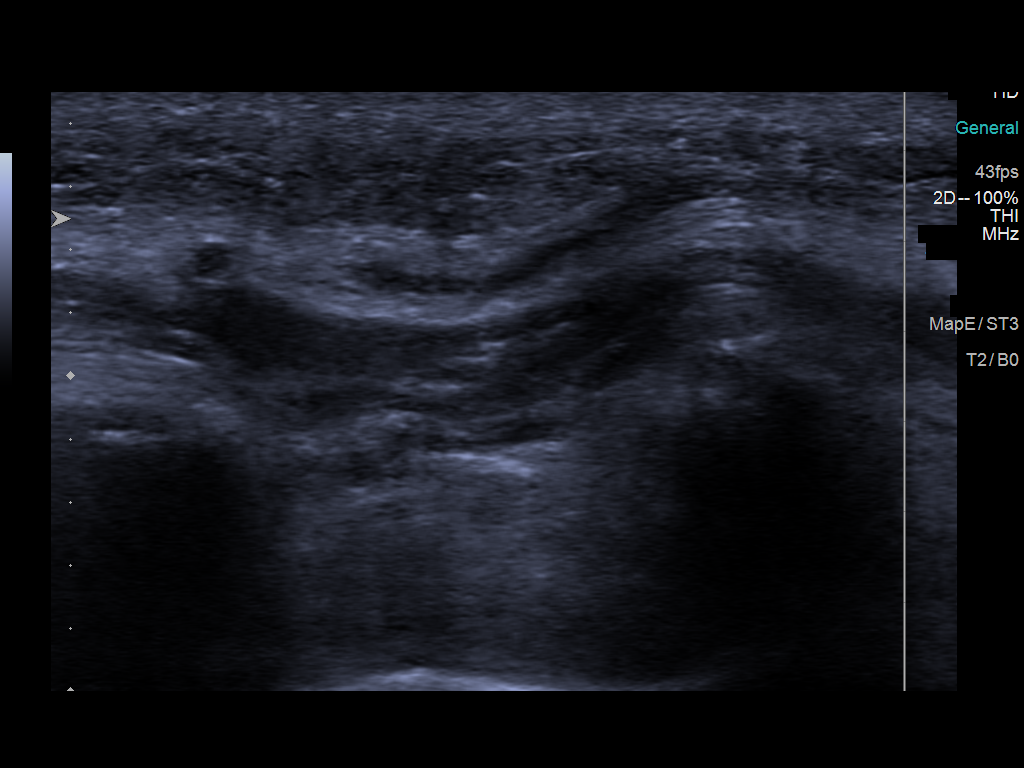
[im 4/7]
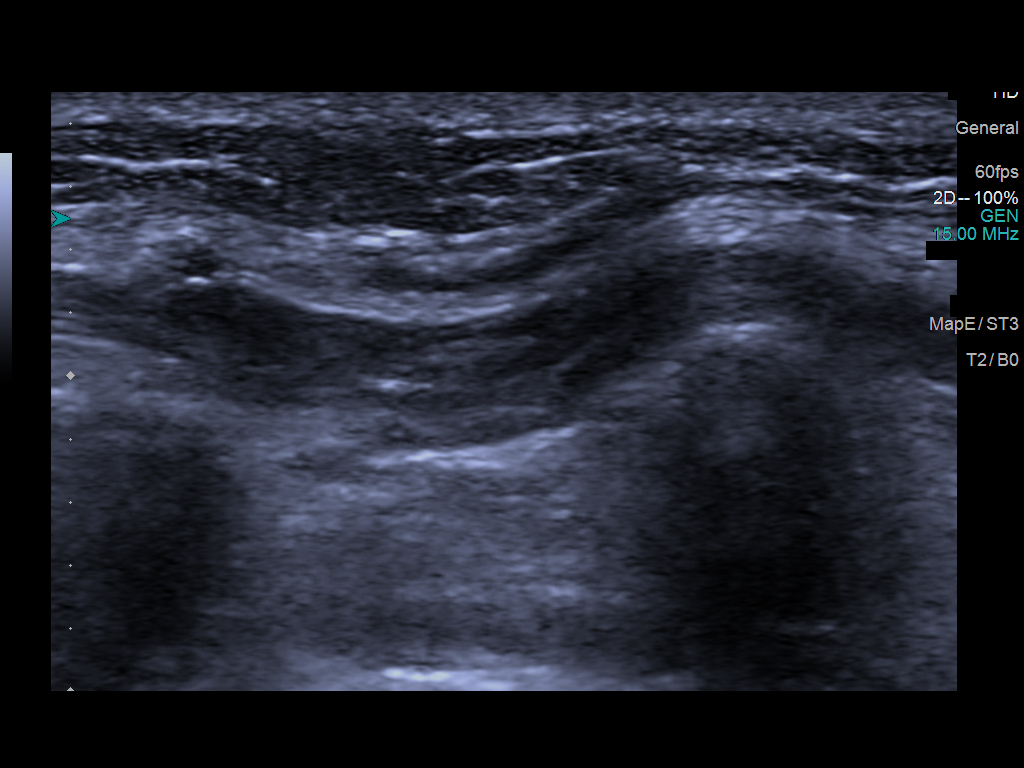
[im 5/7]
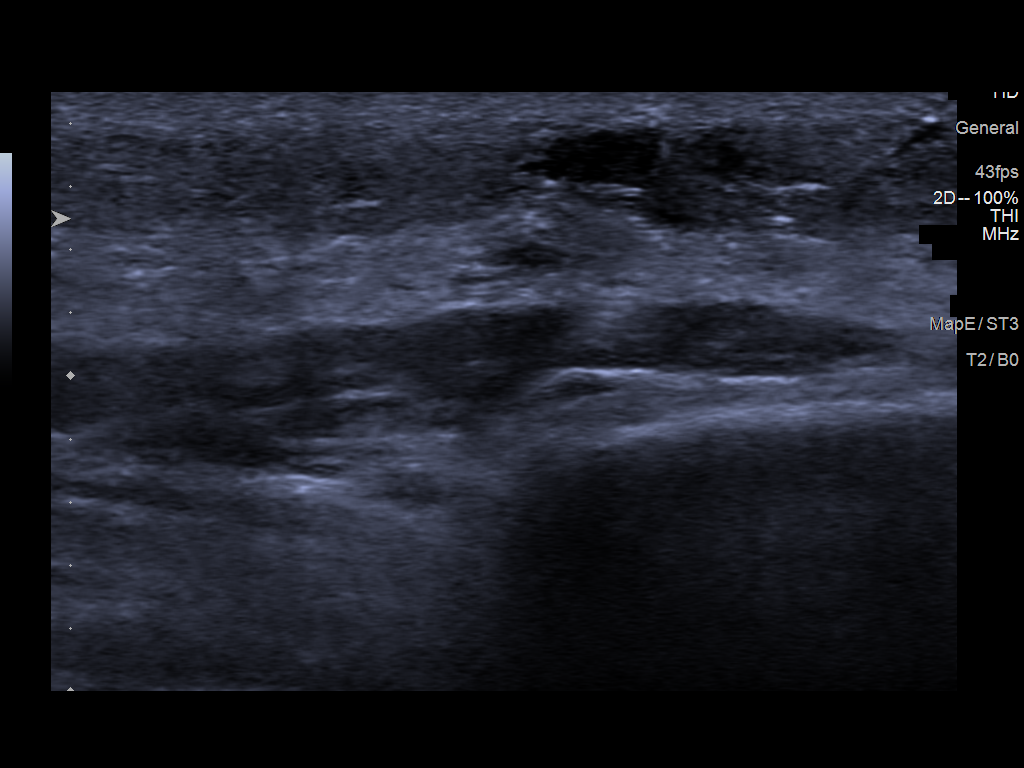
[im 6/7]
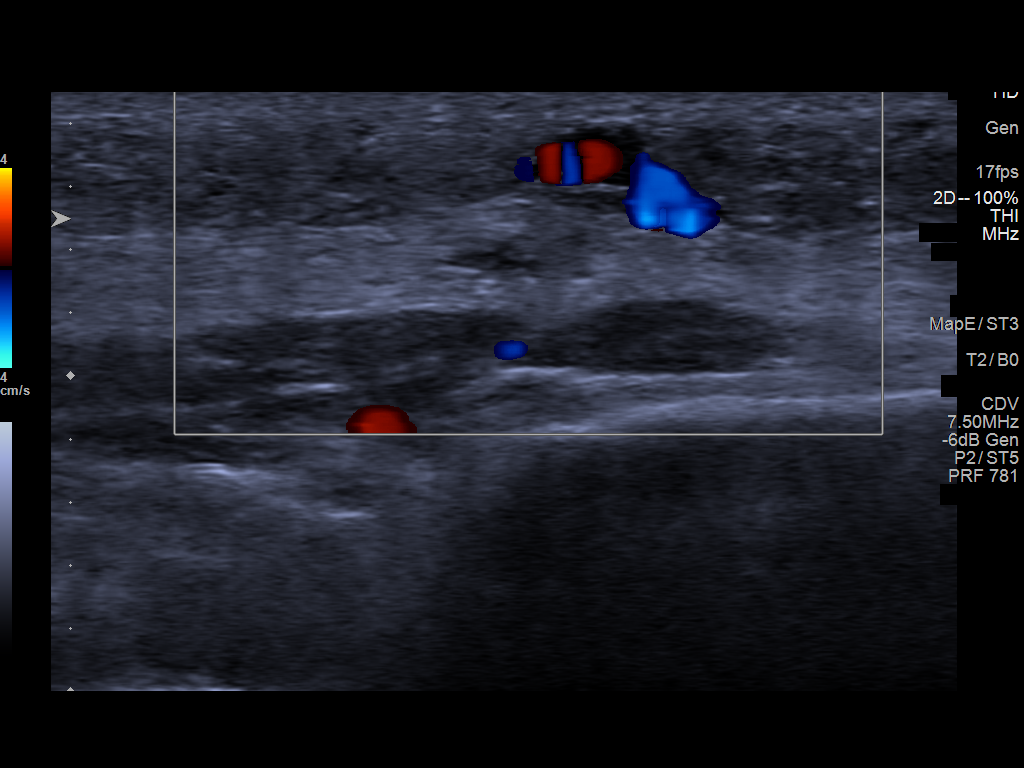
[im 7/7]
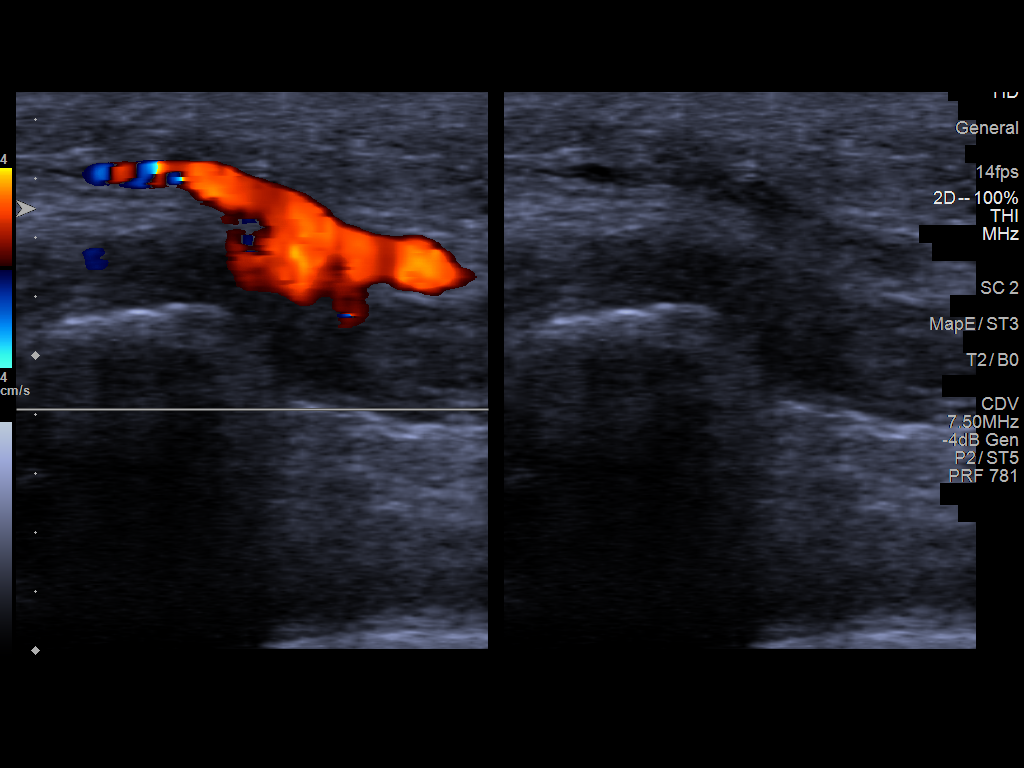

[7 of 7 positions shown; findings below may reference images not displayed]

ACR Breast Density Category c: The breast tissue is heterogeneously
dense, which may obscure small masses.
FINDINGS: Additional mammographic views of the right breast demonstrate no
suspicious masses or areas of architectural distortion. The
previously questioned asymmetry in the posterior central right
breast corresponds to a convoluted prominent blood vessel.

Targeted right breast ultrasound of the medial central breast, the
plausible location of the mammographically seen asymmetry
corresponds no suspicious masses or shadowing lesions. A large
caliber vessel is confirmed in the right 3 o'clock breast 6 cm from
the nipple.
IMPRESSION: No mammographic or sonographic evidence of right breast malignancy.

RECOMMENDATION:
Screening mammogram in one year.(Code:[7O])

I have discussed the findings and recommendations with the patient.
If applicable, a reminder letter will be sent to the patient
regarding the next appointment.

BI-RADS CATEGORY  2: Benign.

## 2020-12-30 ENCOUNTER — Other Ambulatory Visit: Payer: Self-pay | Admitting: Neurology

## 2020-12-30 DIAGNOSIS — G8929 Other chronic pain: Secondary | ICD-10-CM

## 2020-12-30 DIAGNOSIS — M79602 Pain in left arm: Secondary | ICD-10-CM

## 2020-12-30 DIAGNOSIS — M545 Low back pain, unspecified: Secondary | ICD-10-CM

## 2020-12-30 DIAGNOSIS — R200 Anesthesia of skin: Secondary | ICD-10-CM

## 2021-01-24 ENCOUNTER — Ambulatory Visit
Admission: RE | Admit: 2021-01-24 | Discharge: 2021-01-24 | Disposition: A | Discharge status: Home / Self Care | Payer: Federal, State, Local not specified - PPO | Source: Ambulatory Visit | Attending: Neurology | Admitting: Neurology

## 2021-01-24 DIAGNOSIS — M79602 Pain in left arm: Secondary | ICD-10-CM

## 2021-01-24 DIAGNOSIS — R200 Anesthesia of skin: Secondary | ICD-10-CM

## 2021-01-24 DIAGNOSIS — M545 Low back pain, unspecified: Secondary | ICD-10-CM

## 2021-01-24 IMAGING — MR MR LUMBAR SPINE W/O CM
4 of 5 series · 26 of 48 positions shown · non-contrast
Comparison: None.

CLINICAL DATA: Low back pain and right foot pain for several years

EXAM:
MRI LUMBAR SPINE WITHOUT CONTRAST
TECHNIQUE: Multiplanar, multisequence MR imaging of the lumbar spine was
performed. No intravenous contrast was administered.

[Series 3: T2 · sagittal · 4.0mm · 0.53mm/px · 5 of 13 slices shown (1 of 2)]
[im 1/13]
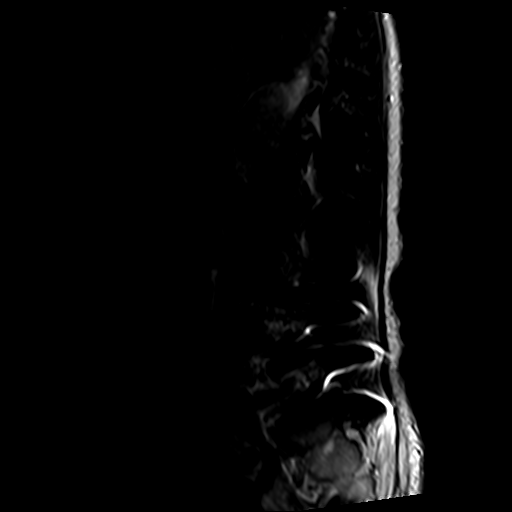
[im 4/13]
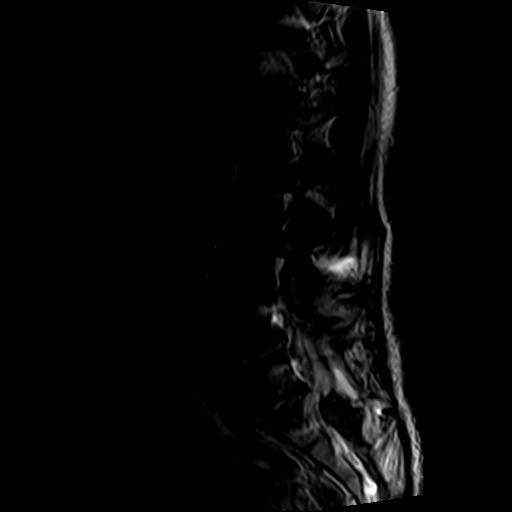
[im 7/13]
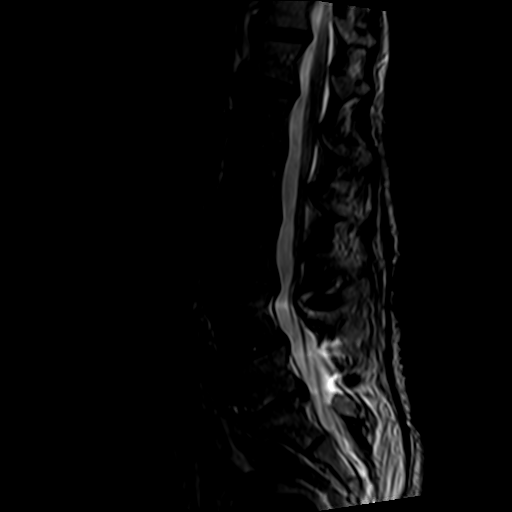
[im 10/13]
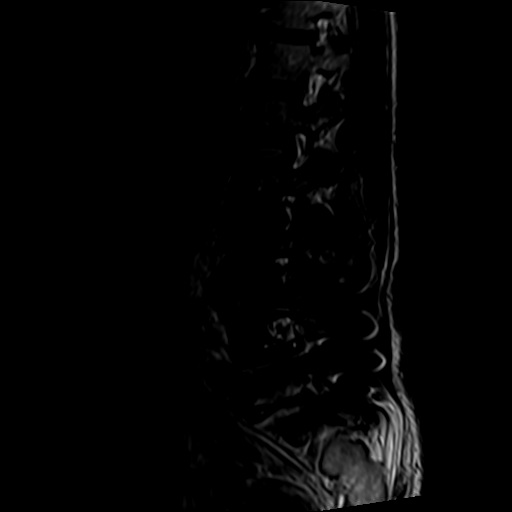
[im 13/13]
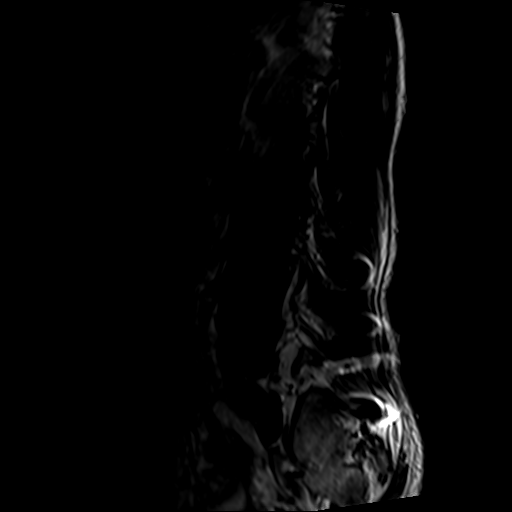

[Series 5: T1 · sagittal · 4.0mm · 0.53mm/px · 6 of 13 slices shown (1 of 2)]
[im 1/13]
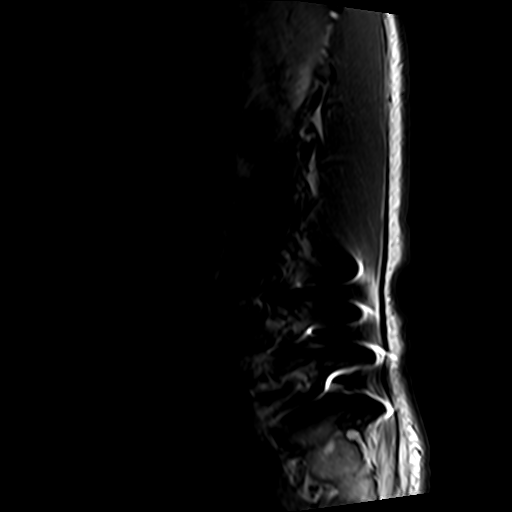
[im 3/13]
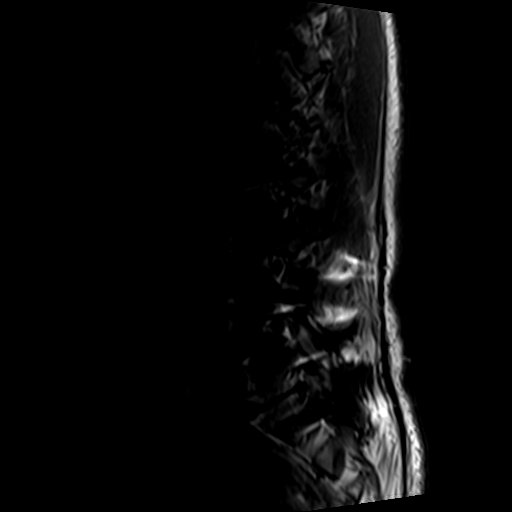
[im 5/13]
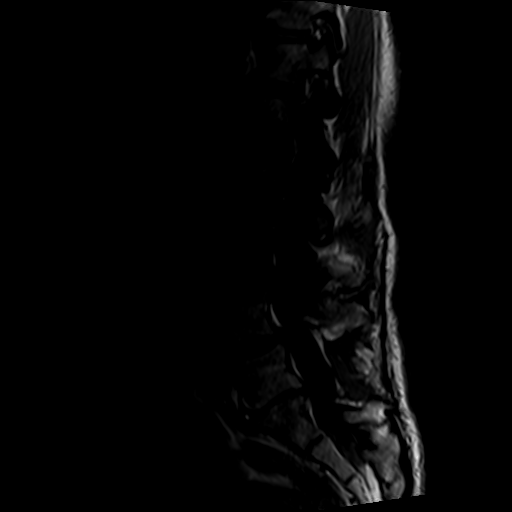
[im 8/13]
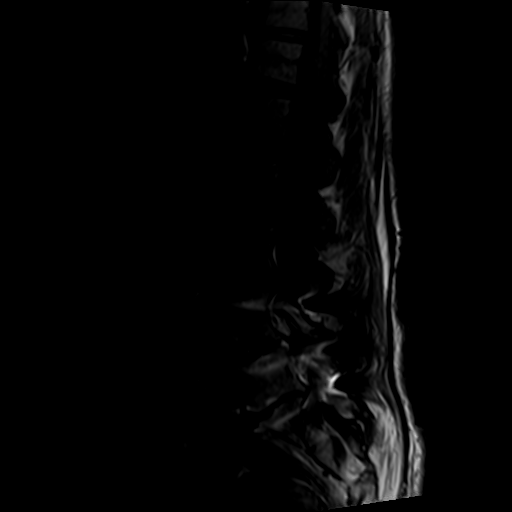
[im 10/13]
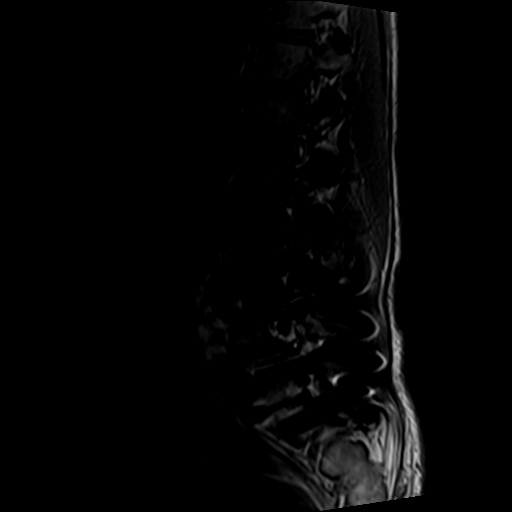
[im 13/13]
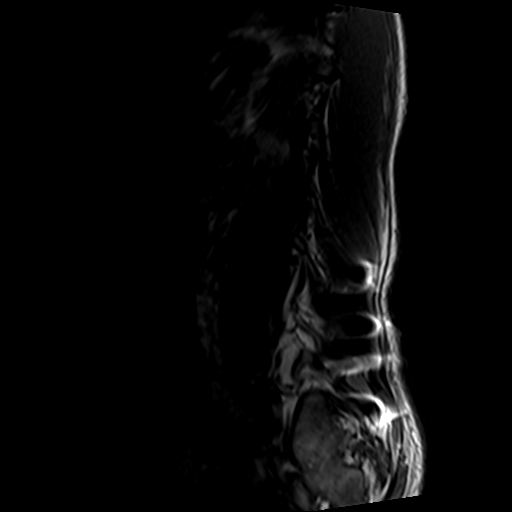

[Series 6: T2 · axial · 4.0mm · 0.70mm/px · z∈[-39,+159]mm · 10 of 35 slices shown (2 of 2)]
[im 3/35]
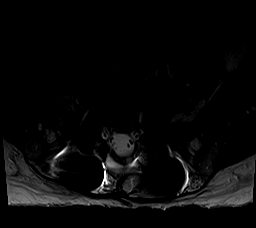
[im 5/35]
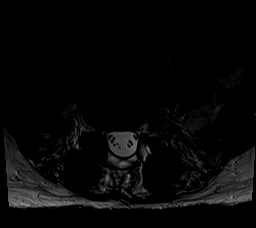
[im 7/35]
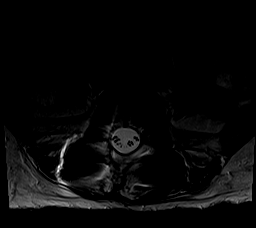
[im 12/35]
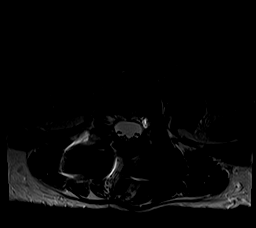
[im 16/35]
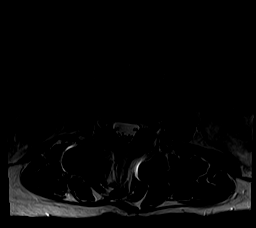
[im 19/35]
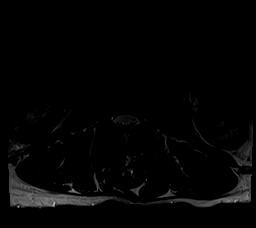
[im 21/35]
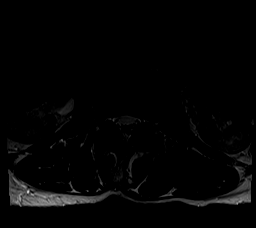
[im 25/35]
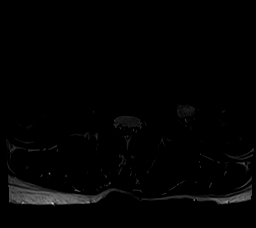
[im 30/35]
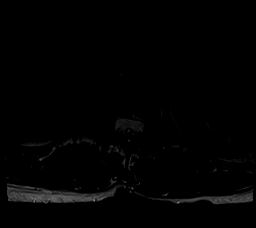
[im 35/35]
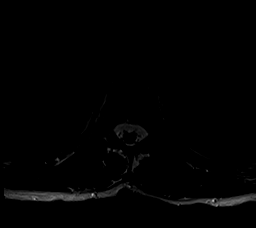

[Series 7: T1 · axial · 4.0mm · 0.35mm/px · z∈[-39,+133]mm · 5 of 35 slices shown (2 of 2)]
[im 3/35]
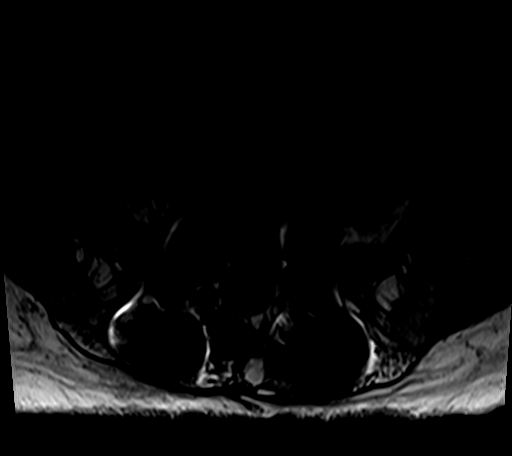
[im 5/35]
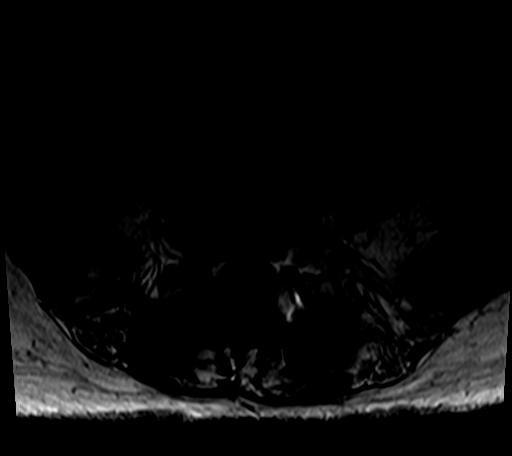
[im 7/35]
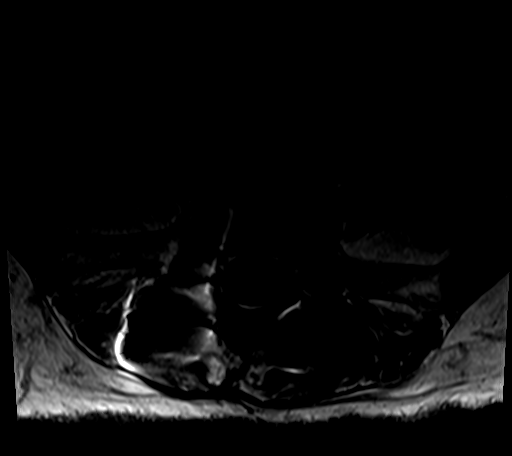
[im 19/35]
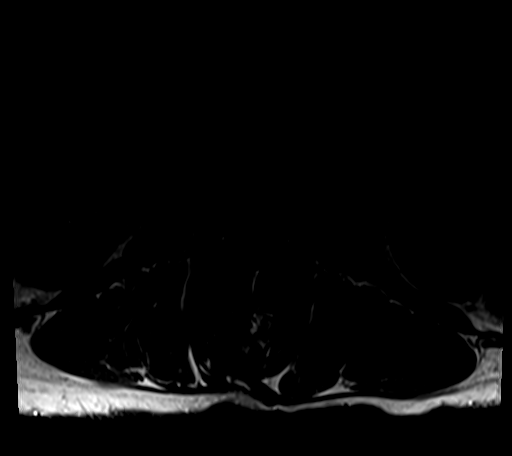
[im 30/35]
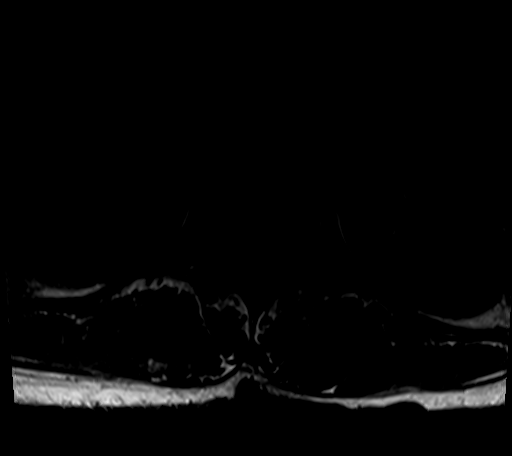

[26 of 48 positions shown; findings below may reference images not displayed]

FINDINGS: Segmentation:  Standard.

Alignment: 3 mm grade 1 anterolisthesis L4 on L5. Trace
retrolisthesis at L5-S1.

Vertebrae: Postsurgical changes from L4-S1 posterior fusion with
L4-5 interbody spacer. No acute fracture. No evidence of discitis.
No suspicious bone lesion.

Conus medullaris and cauda equina: Conus extends to the L1-2 level.
Conus and cauda equina appear normal.

Paraspinal and other soft tissues: Negative.

Disc levels:

T12-L1: Shallow left paracentral disc protrusion. No foraminal or
canal stenosis.

L1-L2: No significant disc protrusion, foraminal stenosis, or canal
stenosis.

L2-L3: Mild annular disc bulge. Mild bilateral facet hypertrophy.
Mild bilateral subarticular recess stenosis. No canal stenosis.
Borderline-mild bilateral foraminal stenosis.

L3-L4: Mild annular disc bulge. Mild facet hypertrophy. Mild
bilateral subarticular recess stenosis. No canal stenosis.
Borderline-mild bilateral foraminal stenosis.

L4-L5: Prior fusion. No evidence of residual foraminal or canal
stenosis.

L5-S1: Prior fusion and decompression. No evidence of residual
foraminal or canal stenosis.
IMPRESSION: 1. Postsurgical changes from L4-S1. No evidence of residual
foraminal or canal stenosis at these levels.
2. Mild disc bulges at L2-3 and L3-4 with mild bilateral
subarticular recess stenosis and borderline-mild bilateral foraminal
stenosis.
3. No canal stenosis at any level.

## 2021-02-25 ENCOUNTER — Other Ambulatory Visit: Payer: Self-pay | Admitting: Neurology

## 2021-02-25 DIAGNOSIS — R0989 Other specified symptoms and signs involving the circulatory and respiratory systems: Secondary | ICD-10-CM

## 2021-02-25 DIAGNOSIS — R200 Anesthesia of skin: Secondary | ICD-10-CM

## 2021-03-09 ENCOUNTER — Other Ambulatory Visit: Payer: Federal, State, Local not specified - PPO

## 2021-03-11 ENCOUNTER — Other Ambulatory Visit: Payer: Self-pay

## 2021-03-11 ENCOUNTER — Ambulatory Visit
Admission: RE | Admit: 2021-03-11 | Discharge: 2021-03-11 | Disposition: A | Discharge status: Home / Self Care | Payer: Federal, State, Local not specified - PPO | Source: Ambulatory Visit | Attending: Neurology | Admitting: Neurology

## 2021-03-11 DIAGNOSIS — R200 Anesthesia of skin: Secondary | ICD-10-CM

## 2021-03-11 DIAGNOSIS — R0989 Other specified symptoms and signs involving the circulatory and respiratory systems: Secondary | ICD-10-CM

## 2021-04-15 ENCOUNTER — Other Ambulatory Visit: Payer: Self-pay | Admitting: Orthopedic Surgery

## 2021-04-15 DIAGNOSIS — S43492A Other sprain of left shoulder joint, initial encounter: Secondary | ICD-10-CM

## 2021-04-15 DIAGNOSIS — M25512 Pain in left shoulder: Secondary | ICD-10-CM

## 2021-04-25 ENCOUNTER — Other Ambulatory Visit: Payer: Self-pay

## 2021-04-25 ENCOUNTER — Ambulatory Visit
Admission: RE | Admit: 2021-04-25 | Discharge: 2021-04-25 | Disposition: A | Discharge status: Home / Self Care | Payer: Federal, State, Local not specified - PPO | Source: Ambulatory Visit | Attending: Orthopedic Surgery | Admitting: Orthopedic Surgery

## 2021-04-25 DIAGNOSIS — M25512 Pain in left shoulder: Secondary | ICD-10-CM

## 2021-04-25 DIAGNOSIS — S43492A Other sprain of left shoulder joint, initial encounter: Secondary | ICD-10-CM

## 2021-04-25 IMAGING — MR MR SHOULDER*L* W/O CM
4 of 5 series · 20 of 40 positions shown · non-contrast
Comparison: None.

CLINICAL DATA: Left shoulder pain.

EXAM:
MRI OF THE LEFT SHOULDER WITHOUT CONTRAST
TECHNIQUE: Multiplanar, multisequence MR imaging of the shoulder was performed.
No intravenous contrast was administered.

[Series 6: T2 fat-sat · axial · left · 3.0mm · 0.47mm/px · z∈[-70,+18]mm · 7 of 27 slices shown (1 of 3)]
[im 1/27]
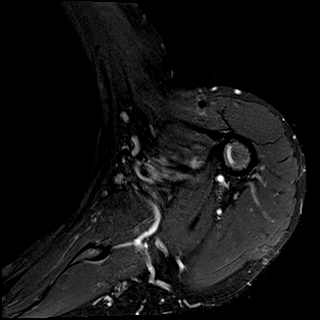
[im 3/27]
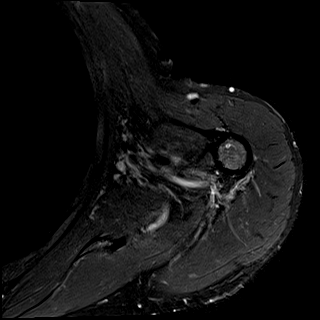
[im 9/27]
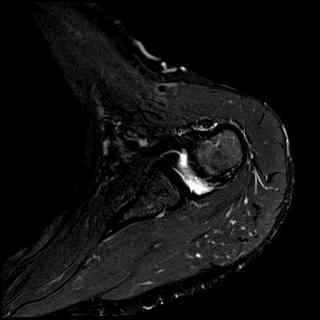
[im 12/27]
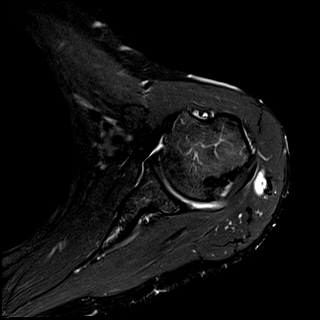
[im 15/27]
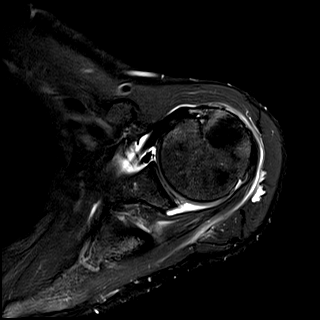
[im 18/27]
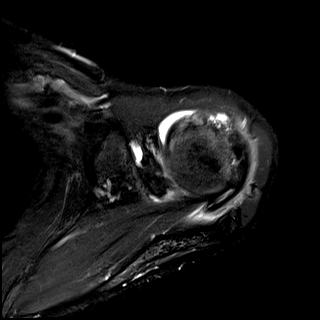
[im 24/27]
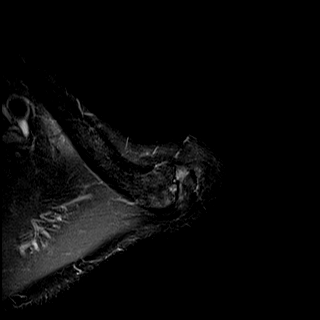

[Series 7: T2 fat-sat · oblique · left · 4.0mm · 0.22mm/px · 3 of 19 slices shown (2 of 3)]
[im 4/19]
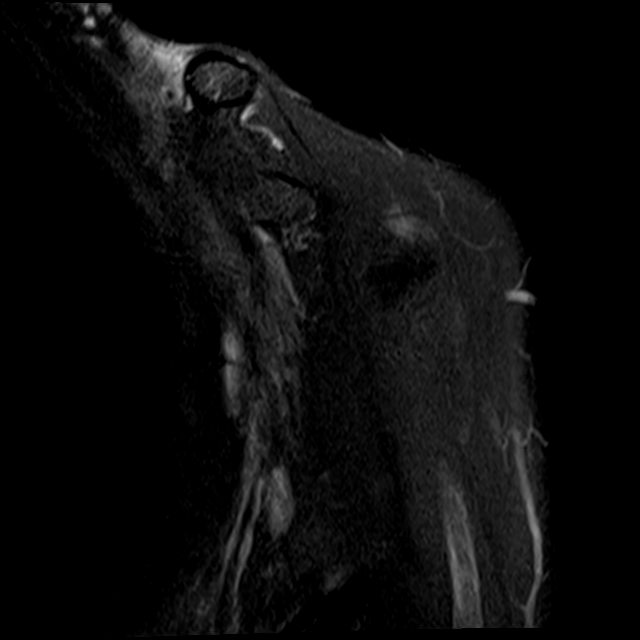
[im 10/19]
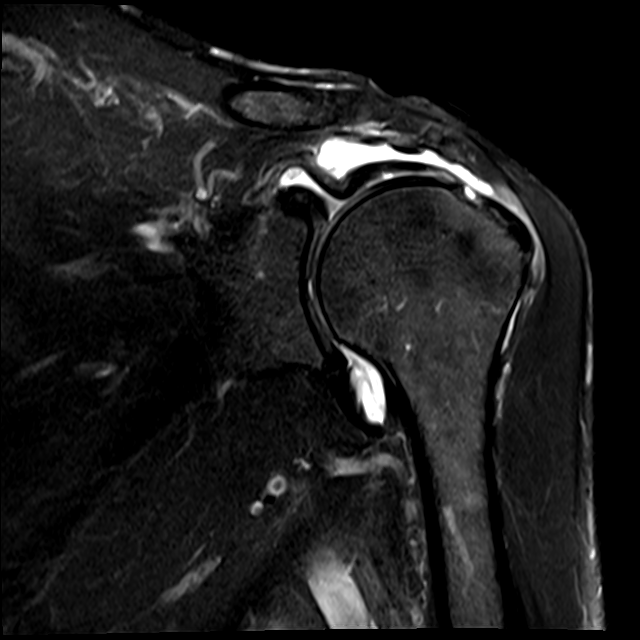
[im 16/19]
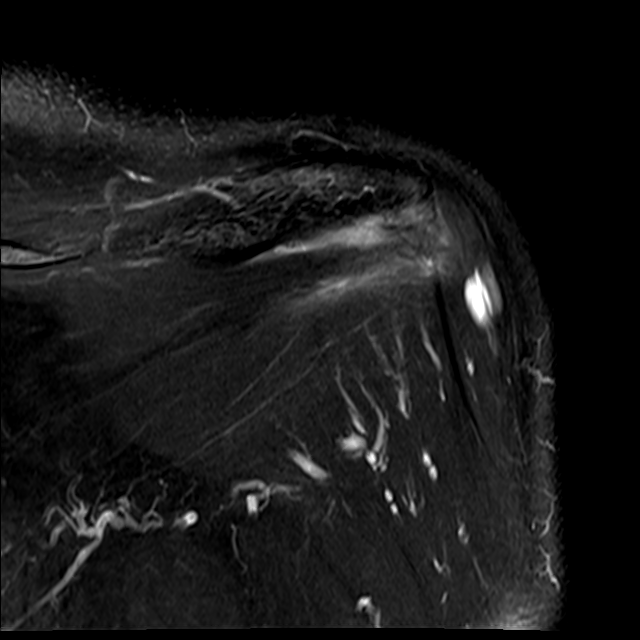

[Series 8: PD · oblique · left · 4.0mm · 0.22mm/px · 7 of 21 slices shown]
[im 1/21]
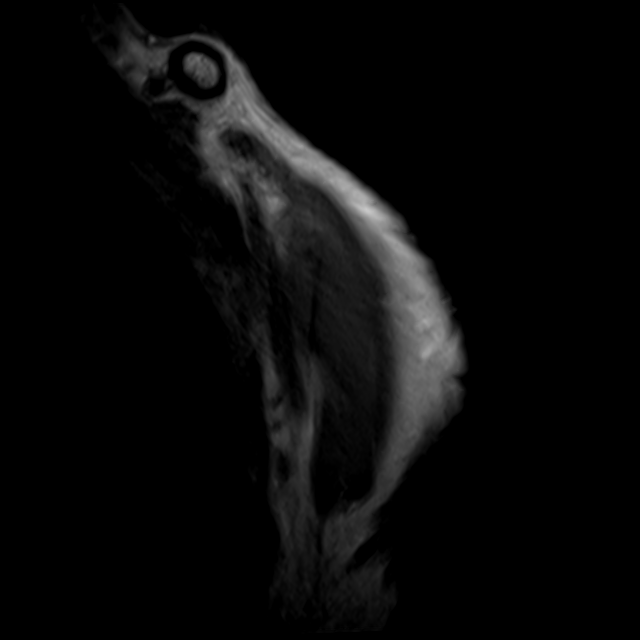
[im 4/21]
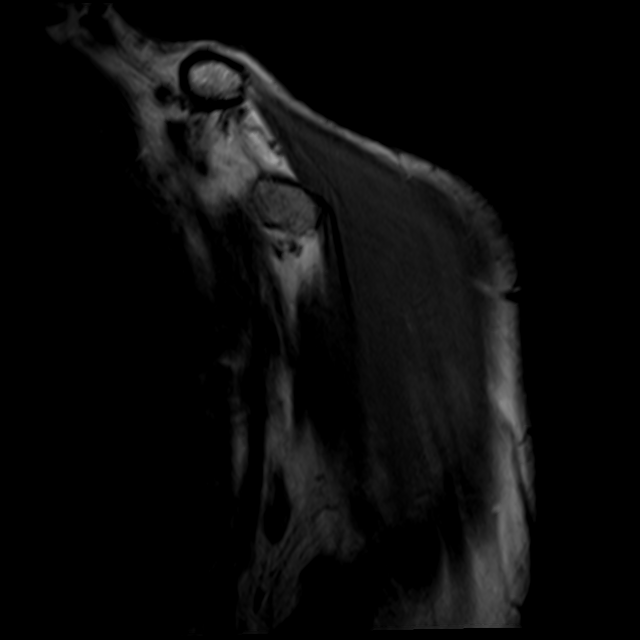
[im 7/21]
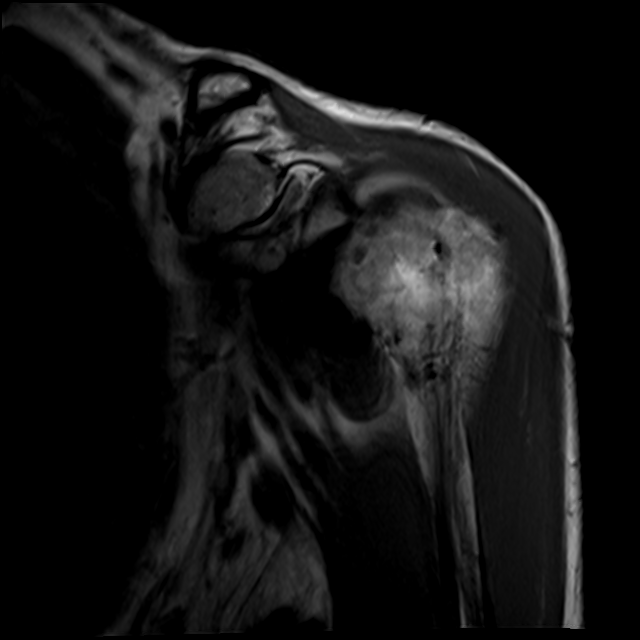
[im 11/21]
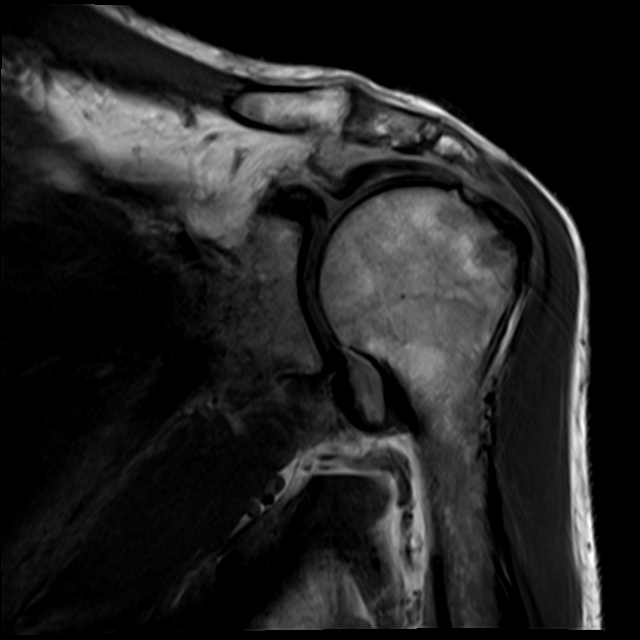
[im 14/21]
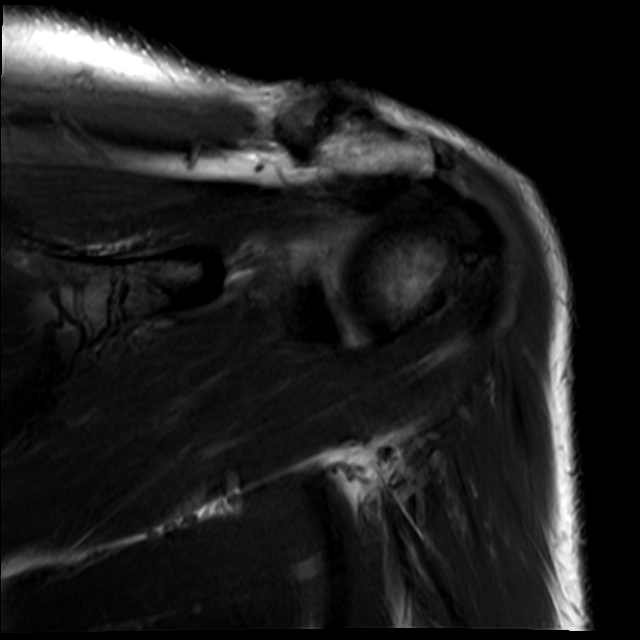
[im 17/21]
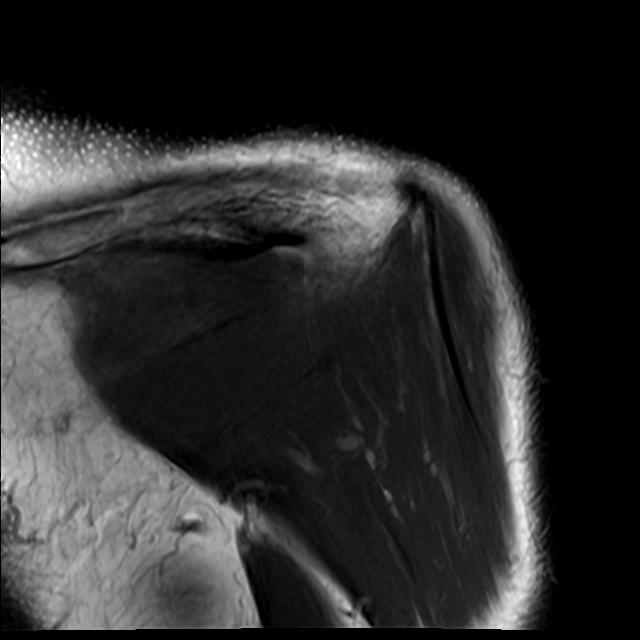
[im 21/21]
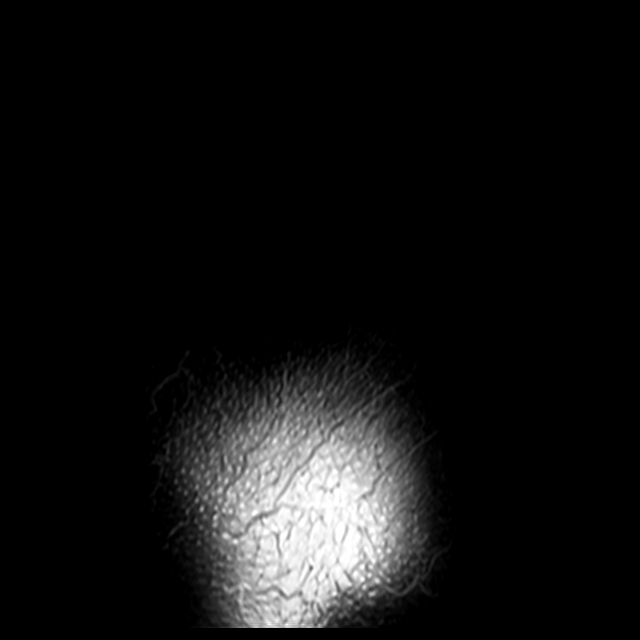

[Series 9: T2 fat-sat · oblique · left · 4.0mm · 0.44mm/px · 3 of 23 slices shown (3 of 3)]
[im 4/23]
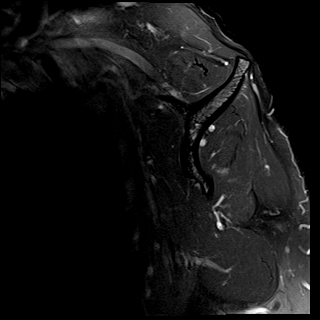
[im 13/23]
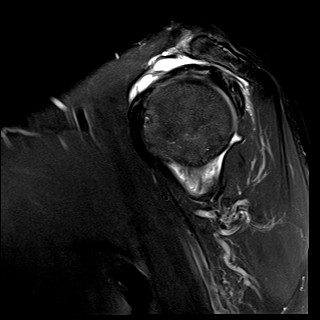
[im 19/23]
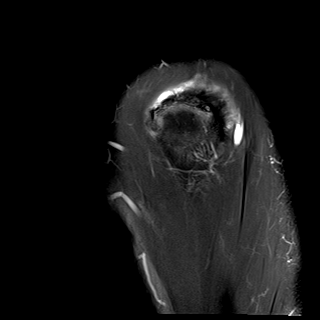

[20 of 40 positions shown; findings below may reference images not displayed]

FINDINGS: :
FINDINGS: Rotator cuff: There is a full-thickness tear of the mid and anterior
supraspinatus tendon measuring up to 2.1 cm in AP dimension
(sagittal image 17) and with at least 10 mm tendon retraction
(coronal image 7). Overall there is moderate attenuation of the
retracted tendon fibers suggesting an element of more proximal
partial-thickness tears. Mild anterior infraspinatus intermediate T2
signal and thickening tendinosis. The subscapularis and teres minor
are intact.

Muscles:  Mild anterior supraspinatus muscle atrophy.

Biceps long head: High-grade attenuation of the proximal long head
of the biceps tendon including within the proximal aspect of the
bicipital groove likely high-grade partial to full-thickness
tearing.

Acromioclavicular Joint: There are mild-to-moderate degenerative
changes of the acromioclavicular joint including joint space
narrowing, subchondral marrow edema, and peripheral osteophytosis.
Type II acromion. Mild glenohumeral joint effusion with mild fluid
within the subacromial/subdeltoid bursa.

Glenohumeral Joint: Moderate thinning of the mid glenoid cartilage.
Moderate thinning of the inferior medial humeral head cartilage.

Labrum: Grossly intact, but evaluation is limited by lack of
intraarticular fluid.

Bones:  No acute fracture.

Other: There is a small region of fluid bright signal extending into
[place] posterior infraspinatus muscle likely a small interstitial tear
(axial image 14, sagittal image 18, coronal image 5).
IMPRESSION: :
IMPRESSION: 1. Full-thickness anterior supraspinatus tendon tear measuring up to
2.1 cm in AP dimension. Mild anterior supraspinatus muscle atrophy
suggests at least a component of this tear is chronic.
2. Mild anterior infraspinatus tendinosis.
3. High-grade partial to full-thickness tearing of the proximal long
head of the biceps tendon.
4. Mild-to-moderate degenerative changes of the acromioclavicular
joint.
5. Mild-to-moderate glenohumeral cartilage degenerative changes.
